# Patient Record
Sex: Female | Born: 1935 | Race: White | Hispanic: No | Smoking: Former smoker
Health system: Southern US, Community
[De-identification: ages and names within clinical notes are randomized; demographics above are authoritative.]

## PROBLEM LIST (undated history)

## (undated) DIAGNOSIS — N898 Other specified noninflammatory disorders of vagina: Secondary | ICD-10-CM

## (undated) DIAGNOSIS — Z87442 Personal history of urinary calculi: Secondary | ICD-10-CM

## (undated) DIAGNOSIS — N952 Postmenopausal atrophic vaginitis: Secondary | ICD-10-CM

## (undated) DIAGNOSIS — K589 Irritable bowel syndrome without diarrhea: Secondary | ICD-10-CM

## (undated) DIAGNOSIS — Z8739 Personal history of other diseases of the musculoskeletal system and connective tissue: Secondary | ICD-10-CM

## (undated) DIAGNOSIS — K219 Gastro-esophageal reflux disease without esophagitis: Secondary | ICD-10-CM

## (undated) DIAGNOSIS — R195 Other fecal abnormalities: Secondary | ICD-10-CM

## (undated) DIAGNOSIS — R194 Change in bowel habit: Secondary | ICD-10-CM

## (undated) DIAGNOSIS — R197 Diarrhea, unspecified: Secondary | ICD-10-CM

## (undated) HISTORY — DX: Personal history of other diseases of the musculoskeletal system and connective tissue: Z87.39

## (undated) HISTORY — DX: Postmenopausal atrophic vaginitis: N95.2

## (undated) HISTORY — DX: Other specified noninflammatory disorders of vagina: N89.8

## (undated) HISTORY — DX: Irritable bowel syndrome, unspecified: K58.9

## (undated) HISTORY — DX: Other fecal abnormalities: R19.5

## (undated) HISTORY — DX: Gastro-esophageal reflux disease without esophagitis: K21.9

## (undated) HISTORY — DX: Change in bowel habit: R19.4

---

## 2001-07-22 ENCOUNTER — Ambulatory Visit (HOSPITAL_COMMUNITY): Admission: RE | Admit: 2001-07-22 | Discharge: 2001-07-22 | Payer: Self-pay

## 2002-08-17 ENCOUNTER — Ambulatory Visit (HOSPITAL_COMMUNITY): Admission: RE | Admit: 2002-08-17 | Discharge: 2002-08-17 | Payer: Self-pay | Admitting: Unknown Physician Specialty

## 2002-08-17 ENCOUNTER — Encounter: Payer: Self-pay | Admitting: Unknown Physician Specialty

## 2003-09-01 ENCOUNTER — Ambulatory Visit (HOSPITAL_COMMUNITY): Admission: RE | Admit: 2003-09-01 | Discharge: 2003-09-01 | Payer: Self-pay | Admitting: Unknown Physician Specialty

## 2003-09-07 ENCOUNTER — Ambulatory Visit (HOSPITAL_COMMUNITY): Admission: RE | Admit: 2003-09-07 | Discharge: 2003-09-07 | Payer: Self-pay | Admitting: Family Medicine

## 2003-11-09 ENCOUNTER — Ambulatory Visit (HOSPITAL_COMMUNITY): Admission: RE | Admit: 2003-11-09 | Discharge: 2003-11-09 | Payer: Self-pay | Admitting: Internal Medicine

## 2004-12-30 ENCOUNTER — Ambulatory Visit (HOSPITAL_COMMUNITY): Admission: RE | Admit: 2004-12-30 | Discharge: 2004-12-30 | Payer: Self-pay | Admitting: Family Medicine

## 2005-07-17 ENCOUNTER — Ambulatory Visit (HOSPITAL_COMMUNITY): Admission: RE | Admit: 2005-07-17 | Discharge: 2005-07-17 | Payer: Self-pay | Admitting: Family Medicine

## 2005-07-21 ENCOUNTER — Ambulatory Visit (HOSPITAL_COMMUNITY): Admission: RE | Admit: 2005-07-21 | Discharge: 2005-07-21 | Payer: Self-pay | Admitting: Family Medicine

## 2006-05-22 ENCOUNTER — Ambulatory Visit (HOSPITAL_COMMUNITY): Admission: RE | Admit: 2006-05-22 | Discharge: 2006-05-22 | Payer: Self-pay | Admitting: Unknown Physician Specialty

## 2006-10-20 ENCOUNTER — Ambulatory Visit (HOSPITAL_COMMUNITY): Admission: RE | Admit: 2006-10-20 | Discharge: 2006-10-20 | Payer: Self-pay | Admitting: Family Medicine

## 2007-01-12 ENCOUNTER — Ambulatory Visit (HOSPITAL_COMMUNITY): Admission: RE | Admit: 2007-01-12 | Discharge: 2007-01-12 | Payer: Self-pay | Admitting: Family Medicine

## 2007-05-24 ENCOUNTER — Ambulatory Visit (HOSPITAL_COMMUNITY): Admission: RE | Admit: 2007-05-24 | Discharge: 2007-05-24 | Payer: Self-pay | Admitting: Unknown Physician Specialty

## 2008-05-09 ENCOUNTER — Ambulatory Visit (HOSPITAL_COMMUNITY): Admission: RE | Admit: 2008-05-09 | Discharge: 2008-05-09 | Payer: Self-pay | Admitting: Family Medicine

## 2008-06-01 ENCOUNTER — Ambulatory Visit (HOSPITAL_COMMUNITY): Admission: RE | Admit: 2008-06-01 | Discharge: 2008-06-01 | Payer: Self-pay | Admitting: Unknown Physician Specialty

## 2009-06-22 ENCOUNTER — Ambulatory Visit (HOSPITAL_COMMUNITY): Admission: RE | Admit: 2009-06-22 | Discharge: 2009-06-22 | Payer: Self-pay | Admitting: Unknown Physician Specialty

## 2010-06-24 ENCOUNTER — Ambulatory Visit (HOSPITAL_COMMUNITY): Admission: RE | Admit: 2010-06-24 | Payer: Self-pay | Admitting: Unknown Physician Specialty

## 2010-08-31 ENCOUNTER — Encounter: Payer: Self-pay | Admitting: Unknown Physician Specialty

## 2010-09-01 ENCOUNTER — Encounter (INDEPENDENT_AMBULATORY_CARE_PROVIDER_SITE_OTHER): Payer: Self-pay | Admitting: Internal Medicine

## 2010-12-27 NOTE — Op Note (Signed)
NAME:  Linda Stein, Linda Stein                         ACCOUNT NO.:  [id number]   MEDICAL RECORD NO.:  [id number]                   PATIENT TYPE:  AMB   LOCATION:  DAY                                  FACILITY:  [facility]   PHYSICIAN:  Lionel December, M.D.                 DATE OF BIRTH:  [birth year]   DATE OF PROCEDURE:  11/09/2003  DATE OF DISCHARGE:                                 OPERATIVE REPORT   PROCEDURE:  Total colonoscopy.   INDICATIONS:  Linda Stein is a 77 year old Caucasian female who is here for a  screening colonoscopy.  The family history is negative for colorectal  carcinoma.  She was not able to take all the prep, she was therefore given a  tap water enema in the preop area.  The procedure was reviewed with the  patient and informed consent was obtained.   PREOPERATIVE MEDICATIONS:  Demerol 50 mg IV, Versed 5 mg IV in divided  doses.   FINDINGS:  The procedure performed in endoscopy suite.  The patient's vital  signs and O2 saturation were monitored during the procedure and remained  stable.  The patient was placed in the left lateral position and rectal  examination performed.  No abnormalities were noted on external or digital  exam.  The Olympus video scope was placed into the rectum and advanced under  direct vision into the sigmoid colon and beyond.  A lot of thick liquid  stool as well as pieces of formed stool were noted throughout the colon.  Several areas were washed from the mucosa.  The scope was passed into the  cecum which was identified by the appendiceal orifice and the ileocecal  valve.  Pictures were taken for the record.  As the scope was withdrawn,  colonic mucosa was carefully examined and she had to be turned onto her back  and onto her right side in order to move the stool away so that the  underlying mucosa could be seen.  It was normal throughout.  The rectal  mucosa similarly was normal.  The scope was retroflexed to examine the  anorectal junction which was  unremarkable.  The endoscope was straightened  and withdrawn.  The patient tolerated the procedure well.   FINAL DIAGNOSIS:  Normal colonoscopy.  Exam was somewhat compromised because  of prep.   RECOMMENDATIONS:  She should continue yearly Hemoccults and consider her  next exam in 10 years from now.      ___________________________________________                                            Lionel December, M.D.   NR/MEDQ  D:  11/09/2003  T:  11/09/2003  Job:  [id number]   cc:   Mila Homer. Sudie Bailey, M.D.  [address]  [place], [place] [address]  Fax: [phone]   Ruthy Dick  [address]  [place]  [place] [address]  Fax: [phone]

## 2011-04-21 ENCOUNTER — Encounter (INDEPENDENT_AMBULATORY_CARE_PROVIDER_SITE_OTHER): Payer: Self-pay | Admitting: *Deleted

## 2011-04-21 ENCOUNTER — Encounter (INDEPENDENT_AMBULATORY_CARE_PROVIDER_SITE_OTHER): Payer: Self-pay

## 2011-05-19 ENCOUNTER — Ambulatory Visit (INDEPENDENT_AMBULATORY_CARE_PROVIDER_SITE_OTHER): Payer: Self-pay | Admitting: Internal Medicine

## 2011-12-08 ENCOUNTER — Other Ambulatory Visit (HOSPITAL_COMMUNITY): Payer: Self-pay | Admitting: Unknown Physician Specialty

## 2011-12-08 DIAGNOSIS — Z139 Encounter for screening, unspecified: Secondary | ICD-10-CM

## 2011-12-09 ENCOUNTER — Ambulatory Visit (HOSPITAL_COMMUNITY)
Admission: RE | Admit: 2011-12-09 | Discharge: 2011-12-09 | Disposition: A | Discharge status: Home / Self Care | Payer: Medicare Other | Source: Ambulatory Visit | Attending: Unknown Physician Specialty | Admitting: Unknown Physician Specialty

## 2011-12-09 DIAGNOSIS — Z1231 Encounter for screening mammogram for malignant neoplasm of breast: Secondary | ICD-10-CM | POA: Insufficient documentation

## 2011-12-09 DIAGNOSIS — Z139 Encounter for screening, unspecified: Secondary | ICD-10-CM

## 2012-11-15 ENCOUNTER — Ambulatory Visit (HOSPITAL_COMMUNITY)
Admission: RE | Admit: 2012-11-15 | Discharge: 2012-11-15 | Disposition: A | Discharge status: Home / Self Care | Payer: Medicare Other | Source: Ambulatory Visit | Attending: Family Medicine | Admitting: Family Medicine

## 2012-11-15 ENCOUNTER — Other Ambulatory Visit (HOSPITAL_COMMUNITY): Payer: Self-pay | Admitting: Family Medicine

## 2012-11-15 DIAGNOSIS — M79609 Pain in unspecified limb: Secondary | ICD-10-CM | POA: Insufficient documentation

## 2012-11-15 DIAGNOSIS — R52 Pain, unspecified: Secondary | ICD-10-CM

## 2013-01-17 ENCOUNTER — Other Ambulatory Visit (HOSPITAL_COMMUNITY): Payer: Self-pay | Admitting: Family Medicine

## 2013-01-17 DIAGNOSIS — Z139 Encounter for screening, unspecified: Secondary | ICD-10-CM

## 2013-01-20 ENCOUNTER — Ambulatory Visit (HOSPITAL_COMMUNITY)
Admission: RE | Admit: 2013-01-20 | Discharge: 2013-01-20 | Disposition: A | Discharge status: Home / Self Care | Payer: Medicare Other | Source: Ambulatory Visit | Attending: Family Medicine | Admitting: Family Medicine

## 2013-01-20 DIAGNOSIS — Z139 Encounter for screening, unspecified: Secondary | ICD-10-CM

## 2013-01-20 DIAGNOSIS — Z1231 Encounter for screening mammogram for malignant neoplasm of breast: Secondary | ICD-10-CM | POA: Insufficient documentation

## 2013-05-04 ENCOUNTER — Ambulatory Visit: Payer: Medicare Other | Admitting: Gastroenterology

## 2014-02-28 ENCOUNTER — Ambulatory Visit (HOSPITAL_COMMUNITY)
Admission: RE | Admit: 2014-02-28 | Discharge: 2014-02-28 | Disposition: A | Discharge status: Home / Self Care | Payer: Medicare Other | Source: Ambulatory Visit | Attending: Family Medicine | Admitting: Family Medicine

## 2014-02-28 ENCOUNTER — Other Ambulatory Visit (HOSPITAL_COMMUNITY): Payer: Self-pay | Admitting: Family Medicine

## 2014-02-28 DIAGNOSIS — M26622 Arthralgia of left temporomandibular joint: Secondary | ICD-10-CM

## 2014-02-28 DIAGNOSIS — R52 Pain, unspecified: Secondary | ICD-10-CM

## 2014-02-28 DIAGNOSIS — M26629 Arthralgia of temporomandibular joint, unspecified side: Secondary | ICD-10-CM | POA: Insufficient documentation

## 2014-03-17 NOTE — Telephone Encounter (Signed)
Open in error

## 2014-03-22 ENCOUNTER — Ambulatory Visit: Payer: Medicare Other | Admitting: Gastroenterology

## 2014-04-26 ENCOUNTER — Ambulatory Visit: Payer: Medicare Other | Admitting: Gastroenterology

## 2014-06-02 ENCOUNTER — Encounter: Payer: Self-pay | Admitting: Adult Health

## 2014-06-07 ENCOUNTER — Ambulatory Visit (INDEPENDENT_AMBULATORY_CARE_PROVIDER_SITE_OTHER): Payer: Medicare Other | Admitting: Adult Health

## 2014-06-07 ENCOUNTER — Encounter: Payer: Self-pay | Admitting: Adult Health

## 2014-06-07 VITALS — BP 140/70 | Ht 61.25 in | Wt 128.0 lb

## 2014-06-07 DIAGNOSIS — N952 Postmenopausal atrophic vaginitis: Secondary | ICD-10-CM | POA: Diagnosis not present

## 2014-06-07 DIAGNOSIS — N898 Other specified noninflammatory disorders of vagina: Secondary | ICD-10-CM | POA: Diagnosis not present

## 2014-06-07 HISTORY — DX: Postmenopausal atrophic vaginitis: N95.2

## 2014-06-07 HISTORY — DX: Other specified noninflammatory disorders of vagina: N89.8

## 2014-06-07 LAB — POCT WET PREP (WET MOUNT)

## 2014-06-07 NOTE — Patient Instructions (Signed)
Atrophic Vaginitis Atrophic vaginitis is a problem of low levels of estrogen in women. This problem can happen at any age. It is most common in women who have gone through menopause ("the change").  HOW WILL I KNOW IF I HAVE THIS PROBLEM? You may have:  Trouble with peeing (urinating), such as:  Going to the bathroom often.  A hard time holding your pee until you reach a bathroom.  Leaking pee.  Having pain when you pee.  Itching or a burning feeling.  Vaginal bleeding and spotting.  Pain during sex.  Dryness of the vagina.  A yellow, bad-smelling fluid (discharge) coming from the vagina. HOW WILL MY DOCTOR CHECK FOR THIS PROBLEM?  During your exam, your doctor will likely find the problem.  If there is a vaginal fluid, it may be checked for infection. HOW WILL THIS PROBLEM BE TREATED? Keep the vulvar skin as clean as possible. Moisturizers and lubricants can help with some of the symptoms. Estrogen replacement can help. There are 2 ways to take estrogen:  Systemic estrogen gets estrogen to your whole body. It takes many weeks or months before the symptoms get better.  You take an estrogen pill.  You use a skin patch. This is a patch that you put on your skin.  If you still have your uterus, your doctor may ask you to take a hormone. Talk to your doctor about the right medicine for you.  Estrogen cream.  This puts estrogen only at the part of your body where you apply it. The cream is put into the vagina or put on the vulvar skin. For some women, estrogen cream works faster than pills or the patch. CAN ALL WOMEN WITH THIS PROBLEM USE ESTROGEN? No. Women with certain types of cancer, liver problems, or problems with blood clots should not take estrogen. Your doctor can help you decide the best treatment for your symptoms. Document Released: 01/14/2008 Document Revised: 08/02/2013 Document Reviewed: 01/14/2008 [facility] Patient Information 2015 [place], [place]. This  information is not intended to replace advice given to you by your health care provider. Make sure you discuss any questions you have with your health care provider. Try LUVENA Follow up prn

## 2014-06-07 NOTE — Progress Notes (Signed)
Subjective:     Patient ID: Linda Stein, female   DOB: Jun 25, 1934, 80 y.o.   MRN: [id number]  HPI Linda Stein is a 80 year old white female, married, not sexually active in complaining of vaginal irritation feels like when used to have yeast infection.She is new to this practice.  Review of Systems See HPI Reviewed past medical,surgical, social and family history. Reviewed medications and allergies.     Objective:   Physical Exam BP 140/70  Ht 5' 1.25" (1.556 m)  Wt 128 lb (58.06 kg)  BMI 23.98 kg/m2   Skin warm and dry.Pelvic: external genitalia is normal in appearance for age, no lesions, vagina:has loss of rugae and moisture and pale in color with some strawberry appearance, cervix:smooth, atrophic, uterus: normal size, shape and contour, non tender, no masses felt, adnexa: no masses or tenderness noted. Wet prep: negative Has redness at rectum but has cream at home for that. Discussed atrophic tissues and could try estrace cream or luvena and she opts for luvena,does not want estrogen if possible.  Assessment:     Vaginal irritation Atrophic vaginitis     Plan:     Try Luvena Follow up prn Review handout on atrophic vaginitis

## 2014-06-12 ENCOUNTER — Encounter: Payer: Self-pay | Admitting: Adult Health

## 2014-06-29 ENCOUNTER — Telehealth: Payer: Self-pay | Admitting: *Deleted

## 2014-06-29 NOTE — Telephone Encounter (Signed)
Used luvena and felt better, now feels irritated has clobetasol at home can use every other day bid to outside tissue, and use luvena every 3 days and let me know how she feels in a bout a week

## 2015-04-04 ENCOUNTER — Other Ambulatory Visit (HOSPITAL_COMMUNITY): Payer: Self-pay | Admitting: Family Medicine

## 2015-04-04 DIAGNOSIS — Z1231 Encounter for screening mammogram for malignant neoplasm of breast: Secondary | ICD-10-CM

## 2015-04-06 ENCOUNTER — Ambulatory Visit (HOSPITAL_COMMUNITY)
Admission: RE | Admit: 2015-04-06 | Discharge: 2015-04-06 | Disposition: A | Discharge status: Home / Self Care | Payer: Medicare Other | Source: Ambulatory Visit | Attending: Family Medicine | Admitting: Family Medicine

## 2015-04-06 DIAGNOSIS — Z1231 Encounter for screening mammogram for malignant neoplasm of breast: Secondary | ICD-10-CM | POA: Diagnosis present

## 2016-07-10 ENCOUNTER — Ambulatory Visit (HOSPITAL_COMMUNITY)
Admission: RE | Admit: 2016-07-10 | Discharge: 2016-07-10 | Disposition: A | Discharge status: Home / Self Care | Payer: Medicare Other | Source: Ambulatory Visit | Attending: Family Medicine | Admitting: Family Medicine

## 2016-07-10 ENCOUNTER — Other Ambulatory Visit (HOSPITAL_COMMUNITY): Payer: Self-pay | Admitting: Family Medicine

## 2016-07-10 DIAGNOSIS — M7989 Other specified soft tissue disorders: Secondary | ICD-10-CM | POA: Insufficient documentation

## 2016-07-10 DIAGNOSIS — R609 Edema, unspecified: Secondary | ICD-10-CM

## 2016-07-10 DIAGNOSIS — M79672 Pain in left foot: Secondary | ICD-10-CM | POA: Diagnosis present

## 2016-09-08 ENCOUNTER — Ambulatory Visit (INDEPENDENT_AMBULATORY_CARE_PROVIDER_SITE_OTHER): Payer: Medicare Other | Admitting: Otolaryngology

## 2016-09-08 DIAGNOSIS — H903 Sensorineural hearing loss, bilateral: Secondary | ICD-10-CM

## 2016-09-08 DIAGNOSIS — H9313 Tinnitus, bilateral: Secondary | ICD-10-CM

## 2016-09-10 ENCOUNTER — Ambulatory Visit: Payer: Medicare Other | Admitting: Podiatry

## 2016-09-18 ENCOUNTER — Encounter: Payer: Self-pay | Admitting: Podiatry

## 2016-09-18 ENCOUNTER — Ambulatory Visit (INDEPENDENT_AMBULATORY_CARE_PROVIDER_SITE_OTHER): Payer: Medicare Other | Admitting: Podiatry

## 2016-09-18 ENCOUNTER — Ambulatory Visit (INDEPENDENT_AMBULATORY_CARE_PROVIDER_SITE_OTHER): Payer: Medicare Other

## 2016-09-18 VITALS — BP 131/71 | HR 76 | Resp 16 | Ht 61.0 in | Wt 127.0 lb

## 2016-09-18 DIAGNOSIS — Q828 Other specified congenital malformations of skin: Secondary | ICD-10-CM

## 2016-09-18 DIAGNOSIS — M779 Enthesopathy, unspecified: Secondary | ICD-10-CM | POA: Diagnosis not present

## 2016-09-18 NOTE — Progress Notes (Signed)
   Subjective:    Patient ID: Linda Stein, female    DOB: Feb 18, 1934, 83 y.o.   MRN: [id number]  HPI  Chief Complaint  Patient presents with  . Foot Pain    Left; Swelling Top of foot, beneath toes x 3 months; Also has pain on medial side of foot beneath great toe.   . Callouses    Right; Plantar Forefoot x 3 weeks. Pt states that she is unable to walk barefoot because it causes pain.     She presents today with a chief complaint of pain to the second metatarsophalangeal joint area of the left foot but calluses to the plantar aspect of the right foot.    Review of Systems     Objective:   Physical Exam vital signs are stable alert and oriented 3. Pulses are strongly palpable. Neurologic sensorium is intact. Deep tendon reflexes are intact. Muscle strength is normal. Hammertoe deformities are noted bilateral. Porokeratotic lesion subsecond metatarsophalangeal joint of the right foot is noted. She has pain on end range of motion of the subtalar joint and of the second metatarsophalangeal joint of the left foot.        Assessment & Plan:  Assessment: Pain in limb secondary porokeratosis plantar aspect of the right foot. And capsulitis second metatarsophalangeal joint left foot.  Plan: Debrided reactive hyperkeratoses. I also injected her left second metatarsophalangeal joint area with Kenalog and local anesthetic placed her in a Darco shoe. Follow up with her in 1 month

## 2016-10-14 ENCOUNTER — Ambulatory Visit: Payer: Medicare Other | Admitting: Podiatry

## 2016-11-04 ENCOUNTER — Ambulatory Visit: Payer: Medicare Other | Admitting: Podiatry

## 2016-11-06 ENCOUNTER — Encounter: Payer: Self-pay | Admitting: Podiatry

## 2016-11-06 ENCOUNTER — Ambulatory Visit (INDEPENDENT_AMBULATORY_CARE_PROVIDER_SITE_OTHER): Payer: Medicare Other | Admitting: Podiatry

## 2016-11-06 DIAGNOSIS — M2042 Other hammer toe(s) (acquired), left foot: Secondary | ICD-10-CM

## 2016-11-06 DIAGNOSIS — M779 Enthesopathy, unspecified: Secondary | ICD-10-CM | POA: Diagnosis not present

## 2016-11-06 NOTE — Progress Notes (Signed)
She presents today for follow-up of her capsulitis second metatarsophalangeal joint left foot states that is okay as long as I keep wearing a shoe.  Objective: Vital signs are stable she is alert and oriented 3 she has pain on palpation second metatarsophalangeal joint of the left foot. Pain on end range of motion of the second metatarsophalangeal joint left foot.  Assessment: Plantar flexed second metatarsal resulting capsulitis and hammertoe deformity.  Plan: Discussed etiology pathology conservative versus surgical therapies injected the area today with anesthetic. She will continue use of the Darco shoe follow-up with her in 1 month. They need to consider MRI or surgical consult.

## 2016-12-04 ENCOUNTER — Encounter: Payer: Self-pay | Admitting: Podiatry

## 2016-12-04 ENCOUNTER — Ambulatory Visit (INDEPENDENT_AMBULATORY_CARE_PROVIDER_SITE_OTHER): Payer: Medicare Other | Admitting: Podiatry

## 2016-12-04 DIAGNOSIS — M2042 Other hammer toe(s) (acquired), left foot: Secondary | ICD-10-CM | POA: Diagnosis not present

## 2016-12-04 DIAGNOSIS — M779 Enthesopathy, unspecified: Secondary | ICD-10-CM | POA: Diagnosis not present

## 2016-12-04 NOTE — Progress Notes (Signed)
She presents today for follow-up of her capsulitis second metatarsophalangeal joint of the left foot. She states it is about 90% improved but still has some tenderness down there.  Objective: Vital signs are stable alert and oriented 3. Pulses are palpable. Neurologic symptoms intact. Deep tendon reflexes are intact. She is hammertoe deformities physical nature. She has pain on palpation to the third metatarsophalangeal joint with moderate to severe fat pad atrophy.  Assessment: Rigid deformity resulting in capsulitis of the forefoot left.  Plan: I injected intra-articular today after sterile Betadine skin prep 2 mg of dexamethasone and local anesthetic directly into the third metatarsophalangeal joint. Patient tolerated this procedure well will follow up with me in 6 weeks if necessary.

## 2017-01-15 ENCOUNTER — Ambulatory Visit: Payer: Medicare Other | Admitting: Podiatry

## 2017-04-23 ENCOUNTER — Encounter: Payer: Self-pay | Admitting: Podiatry

## 2017-04-23 ENCOUNTER — Ambulatory Visit (INDEPENDENT_AMBULATORY_CARE_PROVIDER_SITE_OTHER): Payer: Medicare Other | Admitting: Podiatry

## 2017-04-23 DIAGNOSIS — Q828 Other specified congenital malformations of skin: Secondary | ICD-10-CM

## 2017-04-23 DIAGNOSIS — M779 Enthesopathy, unspecified: Secondary | ICD-10-CM

## 2017-04-23 NOTE — Progress Notes (Signed)
She presents today for follow-up of her capsulitis. States that is doing okay for a while and now is starting to hurt again. She also has calluses on the fifth metatarsophalangeal joint plantarly.  Objective: Vital signs are stable alert and oriented 3 pulses are palpable. She has hammertoe deformities resulting in capsulitis of the second metatarsophalangeal joint as well as a cocked up fifth digit resulting in plantar benign lesion.  Assessment: Hammertoe deformities. Benign porokeratotic lesions.  Plan: Debrided all reactive hyperkeratoses today injected around the second metatarsophalangeal joint with Kenalog and local anesthetic.

## 2017-08-17 ENCOUNTER — Other Ambulatory Visit (HOSPITAL_COMMUNITY): Payer: Self-pay | Admitting: Family Medicine

## 2017-08-17 ENCOUNTER — Ambulatory Visit (HOSPITAL_COMMUNITY)
Admission: RE | Admit: 2017-08-17 | Discharge: 2017-08-17 | Disposition: A | Discharge status: Home / Self Care | Payer: Medicare Other | Source: Ambulatory Visit | Attending: Family Medicine | Admitting: Family Medicine

## 2017-08-17 DIAGNOSIS — R05 Cough: Secondary | ICD-10-CM | POA: Insufficient documentation

## 2017-08-17 DIAGNOSIS — X58XXXA Exposure to other specified factors, initial encounter: Secondary | ICD-10-CM | POA: Diagnosis not present

## 2017-08-17 DIAGNOSIS — S63111A Subluxation of metacarpophalangeal joint of right thumb, initial encounter: Secondary | ICD-10-CM | POA: Diagnosis not present

## 2017-08-17 DIAGNOSIS — R059 Cough, unspecified: Secondary | ICD-10-CM

## 2017-08-17 DIAGNOSIS — R609 Edema, unspecified: Secondary | ICD-10-CM

## 2017-08-17 DIAGNOSIS — M19041 Primary osteoarthritis, right hand: Secondary | ICD-10-CM | POA: Diagnosis not present

## 2017-08-17 DIAGNOSIS — M7989 Other specified soft tissue disorders: Secondary | ICD-10-CM | POA: Diagnosis present

## 2017-09-08 ENCOUNTER — Ambulatory Visit: Payer: Medicare Other | Admitting: Podiatry

## 2017-09-22 ENCOUNTER — Ambulatory Visit: Payer: Medicare Other | Admitting: Podiatry

## 2017-10-06 ENCOUNTER — Ambulatory Visit: Payer: Medicare Other | Admitting: Podiatry

## 2017-10-06 ENCOUNTER — Encounter: Payer: Self-pay | Admitting: Podiatry

## 2017-10-06 DIAGNOSIS — M778 Other enthesopathies, not elsewhere classified: Secondary | ICD-10-CM

## 2017-10-06 DIAGNOSIS — M779 Enthesopathy, unspecified: Principal | ICD-10-CM

## 2017-10-06 DIAGNOSIS — M7752 Other enthesopathy of left foot: Secondary | ICD-10-CM | POA: Diagnosis not present

## 2017-10-06 DIAGNOSIS — Q828 Other specified congenital malformations of skin: Secondary | ICD-10-CM | POA: Diagnosis not present

## 2017-10-06 NOTE — Progress Notes (Signed)
She presents today for follow-up of capsulitis second metatarsophalangeal joint of the left foot.  This is hurting me again and then having to wear that shoe again.  She is also complaining of painful thickened calluses bilateral foot.  Objective: Vital signs are stable alert oriented x3.  Pulses are palpable.  Neurologic sensorium is within normal limits.  She has pain on end range of motion of the third metatarsal phalangeal joint.  This is noted in the left foot.  Reactive hyperkeratosis plantar aspect of the forefoot bilateral.  Assessment: Capsulitis left foot.  Calluses bilateral foot.  Plan: Debrided all reactive hyperkeratosis today thoroughly without iatrogenic lesions.  After sterile Betadine skin prep I injected between the second and third digits of the left foot with 20 mg Kenalog 5 mg Marcaine to the point of maximal tenderness.  Tolerated procedure well without complications.

## 2017-10-16 DIAGNOSIS — R0989 Other specified symptoms and signs involving the circulatory and respiratory systems: Secondary | ICD-10-CM | POA: Insufficient documentation

## 2017-10-16 DIAGNOSIS — R05 Cough: Secondary | ICD-10-CM | POA: Insufficient documentation

## 2017-10-16 DIAGNOSIS — K219 Gastro-esophageal reflux disease without esophagitis: Secondary | ICD-10-CM | POA: Insufficient documentation

## 2017-10-16 DIAGNOSIS — R053 Chronic cough: Secondary | ICD-10-CM | POA: Insufficient documentation

## 2017-11-30 ENCOUNTER — Other Ambulatory Visit (HOSPITAL_COMMUNITY): Payer: Self-pay | Admitting: Family Medicine

## 2017-11-30 ENCOUNTER — Ambulatory Visit (HOSPITAL_COMMUNITY)
Admission: RE | Admit: 2017-11-30 | Discharge: 2017-11-30 | Disposition: A | Discharge status: Home / Self Care | Payer: Medicare Other | Source: Ambulatory Visit | Attending: Family Medicine | Admitting: Family Medicine

## 2017-11-30 DIAGNOSIS — M5136 Other intervertebral disc degeneration, lumbar region: Secondary | ICD-10-CM | POA: Insufficient documentation

## 2017-11-30 DIAGNOSIS — N200 Calculus of kidney: Secondary | ICD-10-CM | POA: Insufficient documentation

## 2017-11-30 DIAGNOSIS — M79604 Pain in right leg: Secondary | ICD-10-CM | POA: Insufficient documentation

## 2017-11-30 DIAGNOSIS — M79605 Pain in left leg: Secondary | ICD-10-CM | POA: Diagnosis present

## 2017-11-30 DIAGNOSIS — M545 Low back pain: Secondary | ICD-10-CM | POA: Insufficient documentation

## 2017-12-15 ENCOUNTER — Other Ambulatory Visit (HOSPITAL_COMMUNITY): Payer: Self-pay | Admitting: Family Medicine

## 2017-12-15 DIAGNOSIS — Z780 Asymptomatic menopausal state: Secondary | ICD-10-CM

## 2017-12-23 ENCOUNTER — Ambulatory Visit (HOSPITAL_COMMUNITY)
Admission: RE | Admit: 2017-12-23 | Discharge: 2017-12-23 | Disposition: A | Discharge status: Home / Self Care | Payer: Medicare Other | Source: Ambulatory Visit | Attending: Family Medicine | Admitting: Family Medicine

## 2017-12-23 DIAGNOSIS — Z780 Asymptomatic menopausal state: Secondary | ICD-10-CM | POA: Diagnosis present

## 2017-12-23 DIAGNOSIS — M810 Age-related osteoporosis without current pathological fracture: Secondary | ICD-10-CM | POA: Diagnosis not present

## 2018-01-18 ENCOUNTER — Other Ambulatory Visit (HOSPITAL_COMMUNITY)
Admission: RE | Admit: 2018-01-18 | Discharge: 2018-01-18 | Disposition: A | Discharge status: Home / Self Care | Payer: Medicare Other | Source: Ambulatory Visit | Attending: *Deleted | Admitting: *Deleted

## 2018-01-18 ENCOUNTER — Ambulatory Visit (HOSPITAL_COMMUNITY)
Admission: RE | Admit: 2018-01-18 | Discharge: 2018-01-18 | Disposition: A | Discharge status: Home / Self Care | Payer: Medicare Other | Source: Ambulatory Visit | Attending: Family Medicine | Admitting: Family Medicine

## 2018-01-18 ENCOUNTER — Other Ambulatory Visit (HOSPITAL_COMMUNITY): Payer: Self-pay | Admitting: Family Medicine

## 2018-01-18 DIAGNOSIS — J189 Pneumonia, unspecified organism: Secondary | ICD-10-CM | POA: Diagnosis present

## 2018-01-18 DIAGNOSIS — R918 Other nonspecific abnormal finding of lung field: Secondary | ICD-10-CM | POA: Diagnosis not present

## 2018-01-18 DIAGNOSIS — J9811 Atelectasis: Secondary | ICD-10-CM | POA: Diagnosis not present

## 2018-01-18 LAB — CBC
HCT: 35.6 % — ABNORMAL LOW (ref 36.0–46.0)
Hemoglobin: 11.7 g/dL — ABNORMAL LOW (ref 12.0–15.0)
MCH: 28.9 pg (ref 26.0–34.0)
MCHC: 32.9 g/dL (ref 30.0–36.0)
MCV: 87.9 fL (ref 78.0–100.0)
PLATELETS: 228 10*3/uL (ref 150–400)
RBC: 4.05 MIL/uL (ref 3.87–5.11)
RDW: 14.5 % (ref 11.5–15.5)
WBC: 5.9 10*3/uL (ref 4.0–10.5)

## 2018-01-18 LAB — BASIC METABOLIC PANEL
Anion gap: 8 (ref 5–15)
BUN: 24 mg/dL — AB (ref 6–20)
CALCIUM: 9.3 mg/dL (ref 8.9–10.3)
CO2: 24 mmol/L (ref 22–32)
CREATININE: 0.74 mg/dL (ref 0.44–1.00)
Chloride: 107 mmol/L (ref 101–111)
GFR calc Af Amer: >60 mL/min (ref >60)
GLUCOSE: 123 mg/dL — AB (ref 65–99)
Potassium: 3.7 mmol/L (ref 3.5–5.1)
SODIUM: 139 mmol/L (ref 135–145)

## 2018-01-22 ENCOUNTER — Ambulatory Visit (HOSPITAL_COMMUNITY)
Admission: RE | Admit: 2018-01-22 | Discharge: 2018-01-22 | Disposition: A | Discharge status: Home / Self Care | Payer: Medicare Other | Source: Ambulatory Visit | Attending: Family Medicine | Admitting: Family Medicine

## 2018-01-22 ENCOUNTER — Other Ambulatory Visit (HOSPITAL_COMMUNITY): Payer: Self-pay | Admitting: Family Medicine

## 2018-01-22 DIAGNOSIS — R05 Cough: Secondary | ICD-10-CM | POA: Insufficient documentation

## 2018-01-22 DIAGNOSIS — J189 Pneumonia, unspecified organism: Secondary | ICD-10-CM | POA: Diagnosis not present

## 2018-01-22 DIAGNOSIS — R059 Cough, unspecified: Secondary | ICD-10-CM

## 2018-02-22 ENCOUNTER — Other Ambulatory Visit (HOSPITAL_COMMUNITY): Payer: Self-pay | Admitting: Family Medicine

## 2018-02-22 DIAGNOSIS — M5136 Other intervertebral disc degeneration, lumbar region: Secondary | ICD-10-CM

## 2018-03-02 ENCOUNTER — Ambulatory Visit (HOSPITAL_COMMUNITY)
Admission: RE | Admit: 2018-03-02 | Discharge: 2018-03-02 | Disposition: A | Discharge status: Home / Self Care | Payer: Medicare Other | Source: Ambulatory Visit | Attending: Family Medicine | Admitting: Family Medicine

## 2018-03-02 DIAGNOSIS — M1288 Other specific arthropathies, not elsewhere classified, other specified site: Secondary | ICD-10-CM | POA: Insufficient documentation

## 2018-03-02 DIAGNOSIS — M4317 Spondylolisthesis, lumbosacral region: Secondary | ICD-10-CM | POA: Diagnosis not present

## 2018-03-02 DIAGNOSIS — M5136 Other intervertebral disc degeneration, lumbar region: Secondary | ICD-10-CM | POA: Diagnosis present

## 2018-03-02 DIAGNOSIS — M48061 Spinal stenosis, lumbar region without neurogenic claudication: Secondary | ICD-10-CM | POA: Insufficient documentation

## 2018-03-29 ENCOUNTER — Other Ambulatory Visit: Payer: Self-pay | Admitting: Nurse Practitioner

## 2018-03-29 DIAGNOSIS — M5136 Other intervertebral disc degeneration, lumbar region: Secondary | ICD-10-CM

## 2018-04-08 ENCOUNTER — Ambulatory Visit
Admission: RE | Admit: 2018-04-08 | Discharge: 2018-04-08 | Disposition: A | Discharge status: Home / Self Care | Payer: Medicare Other | Source: Ambulatory Visit | Attending: Nurse Practitioner | Admitting: Nurse Practitioner

## 2018-04-08 DIAGNOSIS — M5136 Other intervertebral disc degeneration, lumbar region: Secondary | ICD-10-CM

## 2018-04-08 DIAGNOSIS — M51369 Other intervertebral disc degeneration, lumbar region without mention of lumbar back pain or lower extremity pain: Secondary | ICD-10-CM

## 2018-04-08 MED ORDER — IOPAMIDOL (ISOVUE-M 200) INJECTION 41%
1.0000 mL | Freq: Once | INTRAMUSCULAR | Status: AC
  Administered 2018-04-08: 1 mL via EPIDURAL

## 2018-04-08 MED ORDER — METHYLPREDNISOLONE ACETATE 40 MG/ML INJ SUSP (RADIOLOG
120.0000 mg | Freq: Once | INTRAMUSCULAR | Status: AC
  Administered 2018-04-08: 120 mg via EPIDURAL

## 2018-04-08 NOTE — Discharge Instructions (Signed)

## 2018-04-22 ENCOUNTER — Other Ambulatory Visit: Payer: Self-pay

## 2018-04-22 ENCOUNTER — Emergency Department (HOSPITAL_COMMUNITY): Payer: Medicare Other

## 2018-04-22 ENCOUNTER — Emergency Department (HOSPITAL_COMMUNITY)
Admission: EM | Admit: 2018-04-22 | Discharge: 2018-04-22 | Disposition: A | Discharge status: Home / Self Care | Payer: Medicare Other | Attending: Emergency Medicine | Admitting: Emergency Medicine

## 2018-04-22 ENCOUNTER — Encounter (HOSPITAL_COMMUNITY): Payer: Self-pay

## 2018-04-22 DIAGNOSIS — N200 Calculus of kidney: Secondary | ICD-10-CM | POA: Insufficient documentation

## 2018-04-22 DIAGNOSIS — Z87891 Personal history of nicotine dependence: Secondary | ICD-10-CM | POA: Diagnosis not present

## 2018-04-22 DIAGNOSIS — R1032 Left lower quadrant pain: Secondary | ICD-10-CM | POA: Diagnosis present

## 2018-04-22 LAB — URINALYSIS, ROUTINE W REFLEX MICROSCOPIC
BILIRUBIN URINE: NEGATIVE
GLUCOSE, UA: NEGATIVE mg/dL
Ketones, ur: 5 mg/dL — AB
LEUKOCYTES UA: NEGATIVE
NITRITE: NEGATIVE
PROTEIN: 100 mg/dL — AB
Specific Gravity, Urine: 1.014 (ref 1.005–1.030)
pH: 7 (ref 5.0–8.0)

## 2018-04-22 LAB — BASIC METABOLIC PANEL
Anion gap: 5 (ref 5–15)
BUN: 18 mg/dL (ref 8–23)
CALCIUM: 9.1 mg/dL (ref 8.9–10.3)
CHLORIDE: 107 mmol/L (ref 98–111)
CO2: 25 mmol/L (ref 22–32)
CREATININE: 0.75 mg/dL (ref 0.44–1.00)
Glucose, Bld: 115 mg/dL — ABNORMAL HIGH (ref 70–99)
Potassium: 4 mmol/L (ref 3.5–5.1)
Sodium: 137 mmol/L (ref 135–145)

## 2018-04-22 LAB — CBC
HCT: 40.6 % (ref 36.0–46.0)
HEMOGLOBIN: 13.3 g/dL (ref 12.0–15.0)
MCH: 28.6 pg (ref 26.0–34.0)
MCHC: 32.8 g/dL (ref 30.0–36.0)
MCV: 87.3 fL (ref 78.0–100.0)
PLATELETS: 184 10*3/uL (ref 150–400)
RBC: 4.65 MIL/uL (ref 3.87–5.11)
RDW: 14 % (ref 11.5–15.5)
WBC: 9.6 10*3/uL (ref 4.0–10.5)

## 2018-04-22 MED ORDER — ONDANSETRON HCL 4 MG/2ML IJ SOLN
4.0000 mg | Freq: Once | INTRAMUSCULAR | Status: AC
  Administered 2018-04-22: 4 mg via INTRAVENOUS
  Filled 2018-04-22: qty 2

## 2018-04-22 MED ORDER — NAPROXEN 500 MG PO TABS: 500.0000 mg | ORAL_TABLET | Freq: Two times a day (BID) | ORAL | 0 refills | Status: DC

## 2018-04-22 MED ORDER — OXYCODONE-ACETAMINOPHEN 5-325 MG PO TABS: 1.0000 | ORAL_TABLET | Freq: Four times a day (QID) | ORAL | 0 refills | Status: DC | PRN

## 2018-04-22 MED ORDER — KETOROLAC TROMETHAMINE 30 MG/ML IJ SOLN
30.0000 mg | Freq: Once | INTRAMUSCULAR | Status: AC
  Administered 2018-04-22: 30 mg via INTRAVENOUS
  Filled 2018-04-22: qty 1

## 2018-04-22 MED ORDER — SODIUM CHLORIDE 0.9 % IV SOLN
INTRAVENOUS | Status: DC
  Administered 2018-04-22: 13:00:00 via INTRAVENOUS

## 2018-04-22 MED ORDER — HYDROMORPHONE HCL 1 MG/ML IJ SOLN
0.5000 mg | Freq: Once | INTRAMUSCULAR | Status: AC
  Administered 2018-04-22: 0.5 mg via INTRAVENOUS
  Filled 2018-04-22: qty 1

## 2018-04-22 MED ORDER — ONDANSETRON 4 MG PO TBDP: 4.0000 mg | ORAL_TABLET | Freq: Three times a day (TID) | ORAL | 0 refills | Status: DC | PRN

## 2018-04-22 NOTE — ED Triage Notes (Signed)
Pt is having severe pain in her left flank area. States it feels very sharp and stabbing. Does radiate to the front as well. No trouble urinating so far. Is nauseous in triage, but not vomiting.

## 2018-04-22 NOTE — Discharge Instructions (Signed)
Please use the urine strainer to catch the stone as you urinate  Please take the medications exactly as prescribed including Naprosyn twice a day, Zofran as needed for nausea, Percocet 1 tablet every 6 hours as needed for severe pain.  Emergency department for severe or worsening pain vomiting or any fevers.  Call the urology office to arrange follow-up within the next several days if you have not yet passed this kidney stone.

## 2018-04-22 NOTE — ED Provider Notes (Signed)
[facility] EMERGENCY DEPARTMENT Provider Note   CSN: [id number] Arrival date & time: 04/22/18  1158     History   Chief Complaint Chief Complaint  Patient presents with  . Abdominal Pain    HPI Linda Stein is a 84 y.o. female.  HPI  L lower back pain - worsened over the last hour -s tarted this morning. No hx of KS, no other sig PMH Pain radiates across the lower abd.  Pain is constant Worse with movement Nausea but no vomiting, no urinary sx, no f/c. No pain with BM's.  2 BM's today already without blood No CP with this - no recent heavy lifting, no trauma.   No surgical history to back / abdomen other than remote C section.  Past Medical History:  Diagnosis Date  . Atrophic vaginitis 06/07/2014  . Change in bowel habits   . Irritable bowel syndrome   . Mucus in stool   . Vaginal irritation 06/07/2014    Patient Active Problem List   Diagnosis Date Noted  . Vaginal irritation 06/07/2014  . Atrophic vaginitis 06/07/2014    Past Surgical History:  Procedure Laterality Date  . CESAREAN SECTION       OB History    Gravida  1   Para  1   Term      Preterm      AB      Living  1     SAB      TAB      Ectopic      Multiple      Live Births               Home Medications    Prior to Admission medications   Medication Sig Start Date End Date Taking? Authorizing Provider  alendronate (FOSAMAX) 70 MG tablet Take 70 mg by mouth every Monday.  04/13/18  Yes [provider]  Cholecalciferol (VITAMIN D3 PO) Take 1 capsule by mouth daily.   Yes [provider]  naproxen (NAPROSYN) 500 MG tablet Take 1 tablet (500 mg total) by mouth 2 (two) times daily with a meal. 04/22/18   Noemi Chapel, MD  ondansetron (ZOFRAN ODT) 4 MG disintegrating tablet Take 1 tablet (4 mg total) by mouth every 8 (eight) hours as needed for nausea. 04/22/18   Noemi Chapel, MD  oxyCODONE-acetaminophen (PERCOCET) 5-325 MG tablet Take 1 tablet by mouth  every 6 (six) hours as needed. 04/22/18   Noemi Chapel, MD    Family History Family History  Problem Relation Age of Onset  . Other Mother        aneursym  . Stroke Mother   . Cancer Father        bone  . Other Son        hip replacement    Social History Social History   Tobacco Use  . Smoking status: Former Smoker    Types: Cigarettes  . Smokeless tobacco: Never Used  Substance Use Topics  . Alcohol use: No  . Drug use: No     Allergies   Sulfa antibiotics   Review of Systems Review of Systems  All other systems reviewed and are negative.    Physical Exam Updated Vital Signs BP 121/60   Pulse 70   Temp 98 F (36.7 C) (Oral)   Resp 16   Ht 1.575 m (5\' 2" )   Wt 54.9 kg   SpO2 98%   BMI 22.13 kg/m   Physical Exam  Constitutional: She appears well-developed and well-nourished. No distress.  Uncomfortable.  HENT:  Head: Normocephalic and atraumatic.  Mouth/Throat: Oropharynx is clear and moist. No oropharyngeal exudate.  Eyes: Pupils are equal, round, and reactive to light. Conjunctivae and EOM are normal. Right eye exhibits no discharge. Left eye exhibits no discharge. No scleral icterus.  Neck: Normal range of motion. Neck supple. No JVD present. No thyromegaly present.  Cardiovascular: Normal rate, regular rhythm, normal heart sounds and intact distal pulses. Exam reveals no gallop and no friction rub.  No murmur heard. Pulmonary/Chest: Effort normal and breath sounds normal. No respiratory distress. She has no wheezes. She has no rales.  Abdominal: Soft. Bowel sounds are normal. She exhibits no distension and no mass. There is no tenderness.  BS normal - no ttp, no pulsating masses.  Musculoskeletal: Normal range of motion. She exhibits no edema or tenderness.  Has ttp in the L SI joint - no other spinal ttp, no CVA ttp.  Lymphadenopathy:    She has no cervical adenopathy.  Neurological: She is alert. Coordination normal.  Normal sensation and  normal strength / pedal pulses of legs.  Skin: Skin is warm and dry. No rash noted. No erythema.  Psychiatric: She has a normal mood and affect. Her behavior is normal.  Nursing note and vitals reviewed.    ED Treatments / Results  Labs (all labs ordered are listed, but only abnormal results are displayed) Labs Reviewed  URINALYSIS, ROUTINE W REFLEX MICROSCOPIC - Abnormal; Notable for the following components:      Result Value   APPearance HAZY (*)    Hgb urine dipstick LARGE (*)    Ketones, ur 5 (*)    Protein, ur 100 (*)    RBC / HPF >50 (*)    Bacteria, UA RARE (*)    All other components within normal limits  BASIC METABOLIC PANEL - Abnormal; Notable for the following components:   Glucose, Bld 115 (*)    All other components within normal limits  CBC    EKG None  Radiology Ct Renal Stone Study  Result Date: 04/22/2018 CLINICAL DATA:  Left-sided flank pain since this morning now in the low left pelvic region, no hematuria EXAM: CT ABDOMEN AND PELVIS WITHOUT CONTRAST TECHNIQUE: Multidetector CT imaging of the abdomen and pelvis was performed following the standard protocol without IV contrast. COMPARISON:  None. FINDINGS: Lower chest: A tiny nodule in the sub pleural space in the posterior left lower lobe is noted on image number 19 series 4 of 4 mm in diameter of doubtful significance in this age patient. Otherwise the lungs are clear. The heart is mildly enlarged. No pericardial effusion is seen. There does appear to be a small hiatal hernia present. Hepatobiliary: The liver is unremarkable in the unenhanced state. Calcified gallstones layer in the gallbladder and the gallbladder wall is not thickened. Pancreas: The pancreas is normal in size and the pancreatic duct is not dilated. Spleen: The spleen is somewhat elongated but unremarkable. Adrenals/Urinary Tract: The adrenal glands appear normal. Right renal calculi are present with a 7 mm calculus in the upper pole on the right  which is nonobstructing. A 7 mm calculus also is noted within the right renal pelvis which may intermittently obstruct. No current obstruction on the right is seen. However, there is fullness of the left pelvocaliceal system left and left ureter to point of obstruction by a 6 mm distal left ureteral calculus only a few cms from the expected left  UV junction. Urinary bladder is moderately distended and is unremarkable. Stomach/Bowel: The stomach is relatively decompressed and there is a small hiatal hernia present. No abnormality of small bowel is noted. The colon is somewhat elongated and tortuous. There is feces within the right colon. The appendix appears decompressed and there are small appendicoliths present. No inflammatory process is noted. Vascular/Lymphatic: Abdominal aorta is normal in caliber with moderate abdominal aortic atherosclerosis noted. No adenopathy is seen. Reproductive: The uterus is normal in size. No adnexal lesion is seen. No free fluid is noted within the pelvis Other: No abdominal wall hernia is seen. Musculoskeletal: The lumbar vertebrae are normal alignment. There is degenerative disc disease at L1-2 and L4-5 levels. No acute abnormality is seen. IMPRESSION: 1. Mild to moderate left hydronephrosis and hydroureter to point of obstruction by 6 mm distal left ureteral calculus only a few cm from the expected left UV junction. 2. Several right renal calculi which are nonobstructive. A 7 mm calculus is present within the right renal pelvis which may intermittently obstruct. 3. The appendix is normal in contour, but there are small appendicoliths present. 4. Moderate abdominal aortic atherosclerosis. 5. 4 mm subpleural nodule in the left lower lobe of doubtful clinical significance in this age patient. Electronically Signed   By: Ivar Drape M.D.   On: 04/22/2018 15:27    Procedures Procedures (including critical care time)  Medications Ordered in ED Medications  0.9 %  sodium chloride  infusion ( Intravenous Rate/Dose Verify 04/22/18 1339)  ondansetron (ZOFRAN) injection 4 mg (4 mg Intravenous Given 04/22/18 1302)  ketorolac (TORADOL) 30 MG/ML injection 30 mg (30 mg Intravenous Given 04/22/18 1302)  HYDROmorphone (DILAUDID) injection 0.5 mg (0.5 mg Intravenous Given 04/22/18 1505)     Initial Impression / Assessment and Plan / ED Course  I have reviewed the triage vital signs and the nursing notes.  Pertinent labs & imaging results that were available during my care of the patient were reviewed by me and considered in my medical decision making (see chart for details).     The patient has no surgical findings on exam including any neurologic findings of her legs to indicate spinal cord problems, she has a bedside ultrasound performed by myself showing no signs of hydronephrosis, these images were not archived.  She will need a urinalysis, pain and nausea control and a CT renal study to rule out renal stones or ureterolithiasis.  Patient agreeable  Urinalysis consistent with hematuria, blood counts normal, metabolic panel normal including creatinine and renal function.  CT of the abdomen and pelvis renal protocol shows hydronephrosis on the left side with a distal 6 mm kidney stone.  The patient has nearly completely passed the stone as it is very close to the ureterovesical junction.  She will be referred to urology, she will be given medications for home including Flomax, pain medication and antiemetic.  This was discussed with the patient and her husband at the bedside, they are in agreement with the plan.  She states she is asymptomatic at discharge  Final Clinical Impressions(s) / ED Diagnoses   Final diagnoses:  Kidney stone    ED Discharge Orders         Ordered    naproxen (NAPROSYN) 500 MG tablet  2 times daily with meals     04/22/18 1643    ondansetron (ZOFRAN ODT) 4 MG disintegrating tablet  Every 8 hours PRN     04/22/18 1643    oxyCODONE-acetaminophen  (PERCOCET) 5-325 MG  tablet  Every 6 hours PRN     04/22/18 1643           Noemi Chapel, MD 04/22/18 1644

## 2018-05-31 ENCOUNTER — Other Ambulatory Visit: Payer: Self-pay | Admitting: Nurse Practitioner

## 2018-05-31 DIAGNOSIS — M5136 Other intervertebral disc degeneration, lumbar region: Secondary | ICD-10-CM

## 2018-06-10 ENCOUNTER — Ambulatory Visit
Admission: RE | Admit: 2018-06-10 | Discharge: 2018-06-10 | Disposition: A | Discharge status: Home / Self Care | Payer: Medicare Other | Source: Ambulatory Visit | Attending: Nurse Practitioner | Admitting: Nurse Practitioner

## 2018-06-10 DIAGNOSIS — M5136 Other intervertebral disc degeneration, lumbar region: Secondary | ICD-10-CM

## 2018-06-10 MED ORDER — METHYLPREDNISOLONE ACETATE 40 MG/ML INJ SUSP (RADIOLOG
120.0000 mg | Freq: Once | INTRAMUSCULAR | Status: AC
  Administered 2018-06-10: 120 mg via EPIDURAL

## 2018-06-10 MED ORDER — IOPAMIDOL (ISOVUE-M 200) INJECTION 41%
1.0000 mL | Freq: Once | INTRAMUSCULAR | Status: AC
  Administered 2018-06-10: 1 mL via EPIDURAL

## 2018-06-11 ENCOUNTER — Other Ambulatory Visit (HOSPITAL_COMMUNITY): Payer: Self-pay | Admitting: Nurse Practitioner

## 2018-06-11 DIAGNOSIS — R1030 Lower abdominal pain, unspecified: Secondary | ICD-10-CM

## 2018-06-15 ENCOUNTER — Ambulatory Visit (HOSPITAL_COMMUNITY)
Admission: RE | Admit: 2018-06-15 | Discharge: 2018-06-15 | Disposition: A | Discharge status: Home / Self Care | Payer: Medicare Other | Source: Ambulatory Visit | Attending: Nurse Practitioner | Admitting: Nurse Practitioner

## 2018-06-15 DIAGNOSIS — R1030 Lower abdominal pain, unspecified: Secondary | ICD-10-CM | POA: Diagnosis present

## 2018-06-15 DIAGNOSIS — K8020 Calculus of gallbladder without cholecystitis without obstruction: Secondary | ICD-10-CM | POA: Insufficient documentation

## 2018-06-15 DIAGNOSIS — N200 Calculus of kidney: Secondary | ICD-10-CM | POA: Insufficient documentation

## 2018-07-14 ENCOUNTER — Other Ambulatory Visit: Payer: Self-pay | Admitting: Nurse Practitioner

## 2018-07-14 DIAGNOSIS — M79604 Pain in right leg: Secondary | ICD-10-CM

## 2018-07-14 DIAGNOSIS — M5136 Other intervertebral disc degeneration, lumbar region: Secondary | ICD-10-CM

## 2018-07-14 DIAGNOSIS — M79605 Pain in left leg: Principal | ICD-10-CM

## 2018-07-30 ENCOUNTER — Other Ambulatory Visit: Payer: Medicare Other

## 2018-08-12 ENCOUNTER — Ambulatory Visit
Admission: RE | Admit: 2018-08-12 | Discharge: 2018-08-12 | Disposition: A | Discharge status: Home / Self Care | Payer: Medicare Other | Source: Ambulatory Visit | Attending: Nurse Practitioner | Admitting: Nurse Practitioner

## 2018-08-12 ENCOUNTER — Other Ambulatory Visit: Payer: Self-pay | Admitting: Nurse Practitioner

## 2018-08-12 DIAGNOSIS — M79605 Pain in left leg: Principal | ICD-10-CM

## 2018-08-12 DIAGNOSIS — M5136 Other intervertebral disc degeneration, lumbar region: Secondary | ICD-10-CM

## 2018-08-12 DIAGNOSIS — M79604 Pain in right leg: Secondary | ICD-10-CM

## 2018-08-12 MED ORDER — METHYLPREDNISOLONE ACETATE 40 MG/ML INJ SUSP (RADIOLOG
120.0000 mg | Freq: Once | INTRAMUSCULAR | Status: AC
  Administered 2018-08-12: 120 mg via EPIDURAL

## 2018-08-12 MED ORDER — IOPAMIDOL (ISOVUE-M 200) INJECTION 41%
1.0000 mL | Freq: Once | INTRAMUSCULAR | Status: AC
  Administered 2018-08-12: 1 mL via EPIDURAL

## 2018-08-12 NOTE — Discharge Instructions (Signed)

## 2018-08-24 ENCOUNTER — Ambulatory Visit (HOSPITAL_COMMUNITY)
Admission: RE | Admit: 2018-08-24 | Discharge: 2018-08-24 | Disposition: A | Discharge status: Home / Self Care | Payer: Medicare Other | Source: Ambulatory Visit | Attending: Nurse Practitioner | Admitting: Nurse Practitioner

## 2018-08-24 ENCOUNTER — Other Ambulatory Visit (HOSPITAL_COMMUNITY): Payer: Self-pay | Admitting: Nurse Practitioner

## 2018-08-24 DIAGNOSIS — M79629 Pain in unspecified upper arm: Secondary | ICD-10-CM | POA: Insufficient documentation

## 2018-10-28 ENCOUNTER — Encounter: Payer: Medicare Other | Admitting: Adult Health

## 2018-11-03 ENCOUNTER — Telehealth: Payer: Self-pay | Admitting: *Deleted

## 2018-11-03 ENCOUNTER — Ambulatory Visit: Payer: Medicare Other | Admitting: Adult Health

## 2018-11-03 ENCOUNTER — Encounter: Payer: Self-pay | Admitting: Adult Health

## 2018-11-03 ENCOUNTER — Other Ambulatory Visit: Payer: Self-pay

## 2018-11-03 VITALS — BP 134/66 | HR 76 | Temp 98.2°F | Ht 62.0 in | Wt 121.5 lb

## 2018-11-03 DIAGNOSIS — R102 Pelvic and perineal pain: Secondary | ICD-10-CM | POA: Diagnosis not present

## 2018-11-03 DIAGNOSIS — N952 Postmenopausal atrophic vaginitis: Secondary | ICD-10-CM

## 2018-11-03 NOTE — Telephone Encounter (Signed)
Left message for pt to call before coming to today's appt. [place]

## 2018-11-03 NOTE — Telephone Encounter (Signed)
Spoke with pt to ask about today's appt. Pt is having heaviness and pressure in pelvic area. It's worse at times. No other symptoms. I spoke with [name] and she advised to come in for today's appt. I advised no visitors or children at today's appt. Also, pt hasn't come in contact with someone that has been confirmed or suspected of having Covid-19 nor is she experiencing any symptoms herself. Linda Stein

## 2018-11-03 NOTE — Progress Notes (Signed)
Patient ID: Linda Stein, female   DOB: 1934-04-01, 85 y.o.   MRN: [id number] History of Present Illness: Linda Stein is a 85 year old white female, married, PM in complaining of pelvic heaviness and pressure for >6 months now, seems to have started after kidney stone last year.Feels like when used to have period, and laying down with heating pad helps.  She had CT renal study 04/22/18 and had obstructing kidney stone on the left and non obstructing stone on the right. Then in November had abdominal US that showed gallstones.  PCP is Dr Karie Kirks.    Current Medications, Allergies, Past Medical History, Past Surgical History, Family History and Social History were reviewed in [employer] record.     Review of Systems: Has pelvic heaviness and pressure for >6 months, started after had kidney stone last year, around September, feels like when used to have period and laying down with heating pad helps. .  Denies any problems with urination or bowel movements Is not sexually active.    Physical Exam:BP 134/66 (BP Location: Right Arm, Patient Position: Sitting, Cuff Size: Normal)   Pulse 76   Temp 98.2 F (36.8 C)   Ht 5\' 2"  (1.575 m)   Wt 121 lb 8 oz (55.1 kg)   BMI 22.22 kg/m  General:  Well developed, well nourished, no acute distress Skin:  Warm and dry Neck:  Midline trachea, normal thyroid, good ROM, no lymphadenopathy Lungs; Clear to auscultation bilaterally Cardiovascular: Regular rate and rhythm Pelvic:  External genitalia is normal in appearance, no lesions.  The vagina is pale and atrophic, used small Pederson speculum. Urethra has no lesions or masses. The cervix is atrophic, and poorly seen.  Uterus is felt to be normal size, shape, and contour.  No adnexal masses or tenderness noted,but left side feels fuller.Bladder is non tender, no masses felt. She has good support, she had  C-section. Rectal: Good sphincter tone, no polyps, or hemorrhoids felt.  Hemoccult  negative(no charge on hemoccult) Psych:  No mood changes, alert and cooperative,seems happy Fall risk is low. PHQ 2 score is 0. Examination chaperoned by Levy Pupa LPN.  Will get [place] to assess uterus and ovaries.   Impression: 1. Pelvic pressure in female   2. Vaginal atrophy       Plan: Will get GYN [place] in about 2 weeks and will talk after that

## 2018-11-17 ENCOUNTER — Other Ambulatory Visit: Payer: Medicare Other

## 2018-12-13 ENCOUNTER — Other Ambulatory Visit: Payer: Medicare Other

## 2018-12-27 ENCOUNTER — Other Ambulatory Visit: Payer: Self-pay

## 2018-12-27 ENCOUNTER — Ambulatory Visit (INDEPENDENT_AMBULATORY_CARE_PROVIDER_SITE_OTHER): Payer: Medicare Other

## 2018-12-27 ENCOUNTER — Other Ambulatory Visit: Payer: Self-pay | Admitting: Adult Health

## 2018-12-27 DIAGNOSIS — R102 Pelvic and perineal pain: Secondary | ICD-10-CM

## 2018-12-27 NOTE — Progress Notes (Signed)
PELVIC US TA/TV:atropic heterogeneous anteverted uterus with mult small calcification,small amount of simple fluid within the endometrium,EEC 2.8 mm,normal ovaries bilat,ovaries appear mobile,some pelvic discomfort during ultrasound

## 2018-12-29 ENCOUNTER — Telehealth: Payer: Self-pay | Admitting: Adult Health

## 2018-12-29 NOTE — Telephone Encounter (Signed)
Pt would like to see if her results can be mailed out to her. Address in chart correct.

## 2018-12-29 NOTE — Telephone Encounter (Signed)
Pt aware that [place] looks normal, but atrophic, discussed trying vaginal estrogen, encouraged her to google it let me know.

## 2019-03-22 ENCOUNTER — Other Ambulatory Visit: Payer: Self-pay

## 2019-03-22 ENCOUNTER — Ambulatory Visit (INDEPENDENT_AMBULATORY_CARE_PROVIDER_SITE_OTHER): Payer: Medicare Other | Admitting: Podiatry

## 2019-03-22 ENCOUNTER — Encounter: Payer: Self-pay | Admitting: Podiatry

## 2019-03-22 VITALS — Temp 97.9°F

## 2019-03-22 DIAGNOSIS — Q828 Other specified congenital malformations of skin: Secondary | ICD-10-CM | POA: Diagnosis not present

## 2019-03-22 DIAGNOSIS — M779 Enthesopathy, unspecified: Secondary | ICD-10-CM

## 2019-03-22 DIAGNOSIS — M778 Other enthesopathies, not elsewhere classified: Secondary | ICD-10-CM

## 2019-03-23 ENCOUNTER — Encounter: Payer: Self-pay | Admitting: Podiatry

## 2019-03-23 NOTE — Progress Notes (Signed)
She presents today for follow-up of capsulitis to the left foot.  States that it started acting up again about a week or so ago I was just glad I had this shoe the last time she was seen was in 2019.  Objective: Vital signs are stable alert and oriented x3.  Pulses are palpable.  Mild hammertoe deformity second metatarsal phalangeal joint of the right foot reveals pain on palpation and end range of motion of the second metatarsal phalangeal joint.  She also has reactive hyperkeratotic lesion left foot.  Does not demonstrate any signs of skin breakdown.  Assessment: Recurrence of the second metatarsal phalangeal joint capsulitis right.  Porokeratosis.  Plan: After sterile Betadine skin prep I injected 10 mg Kenalog 5 mg Marcaine around the joint.  She tolerated procedure well without complications encouraged her to continue to wear the Darco shoe for period of time I debrided all reactive hyperkeratotic tissue.  She will notify us if this does not improve.

## 2019-04-28 ENCOUNTER — Ambulatory Visit: Payer: Medicare Other | Admitting: Podiatry

## 2019-05-20 ENCOUNTER — Other Ambulatory Visit: Payer: Self-pay

## 2019-05-20 DIAGNOSIS — Z20822 Contact with and (suspected) exposure to covid-19: Secondary | ICD-10-CM

## 2019-05-22 LAB — NOVEL CORONAVIRUS, NAA: SARS-CoV-2, NAA: NOT DETECTED

## 2019-05-31 ENCOUNTER — Telehealth: Payer: Self-pay

## 2019-05-31 NOTE — Telephone Encounter (Signed)
Called and informed patient that test for Covid 19 was NEGATIVE. Discussed signs and symptoms of Covid 19 : fever, chills, respiratory symptoms, cough, ENT symptoms, sore throat, SOB, muscle pain, diarrhea, headache, loss of taste/smell, close exposure to COVID-19 patient. Pt instructed to call PCP if they develop the above signs and sx. Pt also instructed to call 911 if having respiratory issues/distress. Discussed MyChart enrollment. Pt verbalized understanding.  

## 2019-08-12 HISTORY — PX: OTHER SURGICAL HISTORY: SHX169

## 2019-08-25 ENCOUNTER — Ambulatory Visit: Payer: Medicare Other | Admitting: Podiatry

## 2019-08-25 ENCOUNTER — Other Ambulatory Visit: Payer: Self-pay

## 2019-08-25 ENCOUNTER — Encounter: Payer: Self-pay | Admitting: Podiatry

## 2019-08-25 DIAGNOSIS — Q828 Other specified congenital malformations of skin: Secondary | ICD-10-CM

## 2019-08-25 DIAGNOSIS — M778 Other enthesopathies, not elsewhere classified: Secondary | ICD-10-CM

## 2019-08-28 NOTE — Progress Notes (Signed)
She presents today chief complaint of painful capsulitis of the second metatarsophalangeal joint and painful calluses.  Objective: Vital signs are stable she is alert and oriented x3 pulses are palpable. Reactive hyper keratomas to the plantar aspect of the bilateral foot and she has pain on end range of motion of the second metatarsophalangeal joints bilateral right greater than left.  Assessment: Capsulitis and reactive porokeratotic lesions.  Plan: Debridement of all reactive hyperkeratotic tissue

## 2019-10-19 ENCOUNTER — Telehealth: Payer: Self-pay

## 2019-10-19 NOTE — Telephone Encounter (Signed)
I spoke with pt, she states she is wanting to know cost of the callous trimming. I informed pt Dr. Elisha Ponder performed foot care maintenance of toenail trimming, and corn and callous trimming, I would transfer her to [facility] to discuss cost.

## 2019-10-19 NOTE — Telephone Encounter (Signed)
Pt called stating that she has seen Dr. Milinda Pointer in the past and he reccommended that she see Dr. Elisha Ponder next week. Pt would like information on what Dr. Elisha Ponder does and etc.

## 2019-10-25 ENCOUNTER — Ambulatory Visit: Payer: Medicare Other | Admitting: Podiatry

## 2019-11-13 IMAGING — XA Imaging study
1 series · 1 of 1 positions shown · non-contrast
Comparison: none

CLINICAL DATA: Lumbosacral spondylosis without myelopathy.
Multilevel spondylitic changes on recent MRI. Low back and bilateral
lower extremity pain.

[Series 1: ortho standard · 1 of 1 slices shown]
[im 1/1]
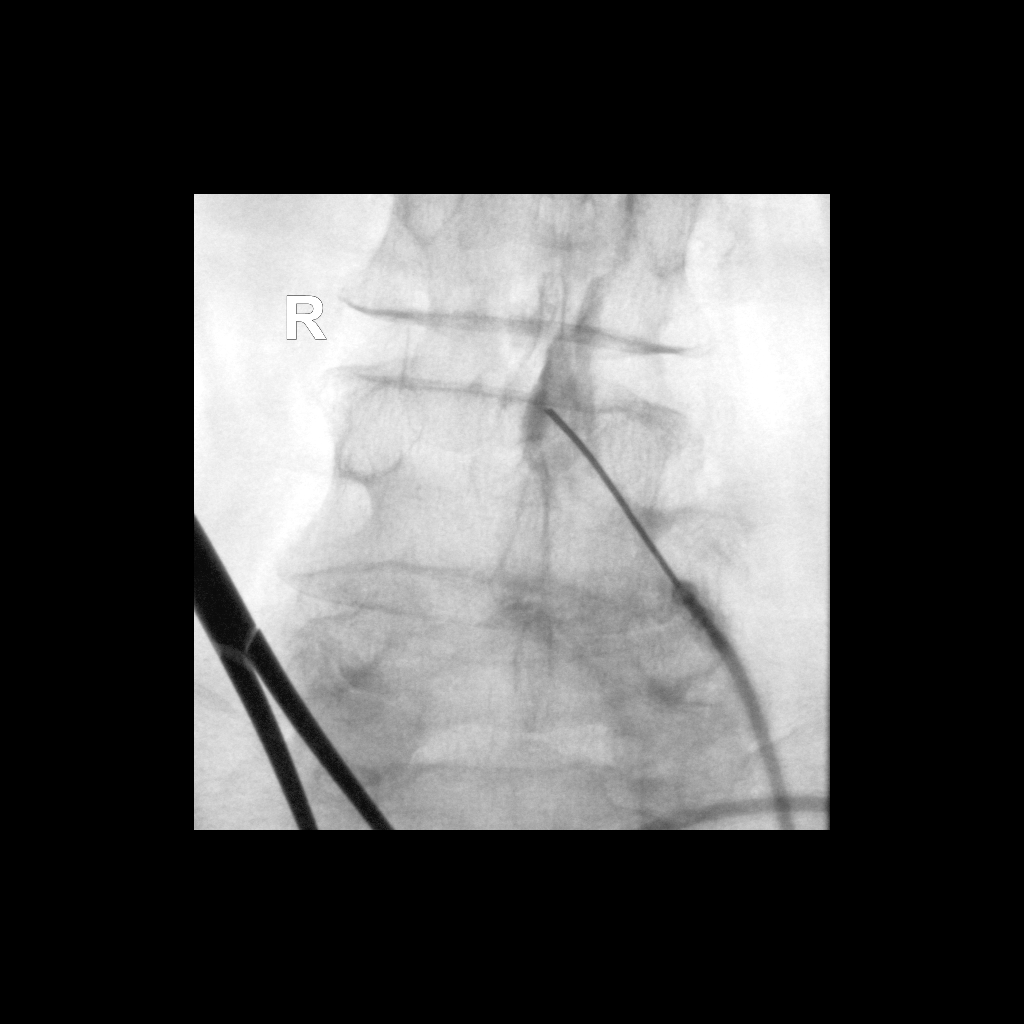

[1 of 1 positions shown; findings below may reference images not displayed]

PROCEDURE:
The procedure, risks, benefits, and alternatives were explained to
the patient. Questions regarding the procedure were encouraged and
answered. The patient understands and consents to the procedure.

LUMBAR EPIDURAL INJECTION:

An interlaminar approach was performed on the right at L3-4 directed
towards midline. The overlying skin was cleansed and anesthetized. A
20 gauge epidural needle was advanced using loss-of-resistance
technique.

DIAGNOSTIC EPIDURAL INJECTION:

Injection of Isovue-M 200 shows a good epidural pattern with spread
above and below the level of needle placement, primarily on the
right no vascular opacification is seen.

THERAPEUTIC EPIDURAL INJECTION:

120 mg of Depo-Medrol mixed with lidocaine 1% 5 mL were instilled.
The procedure was well-tolerated, and the patient was discharged
thirty minutes following the injection in good condition.

FLUOROSCOPY TIME:  46 seconds; 48 u7ymJ DAP

COMPLICATIONS:
None immediate
IMPRESSION: Technically successful epidural injection on the right at L3-4.

## 2020-01-15 IMAGING — XA Imaging study
2 series · 2 of 2 positions shown · non-contrast
Comparison: none

CLINICAL DATA: Lumbosacral spondylosis without myelopathy. 90%
improvement for 1 month following the epidural injection in [DATE].
Recurrent bilateral thigh pain, left worse than right.

[Series 1: ortho adipose · 1 of 1 slices shown (1 of 2)]
[im 1/1]
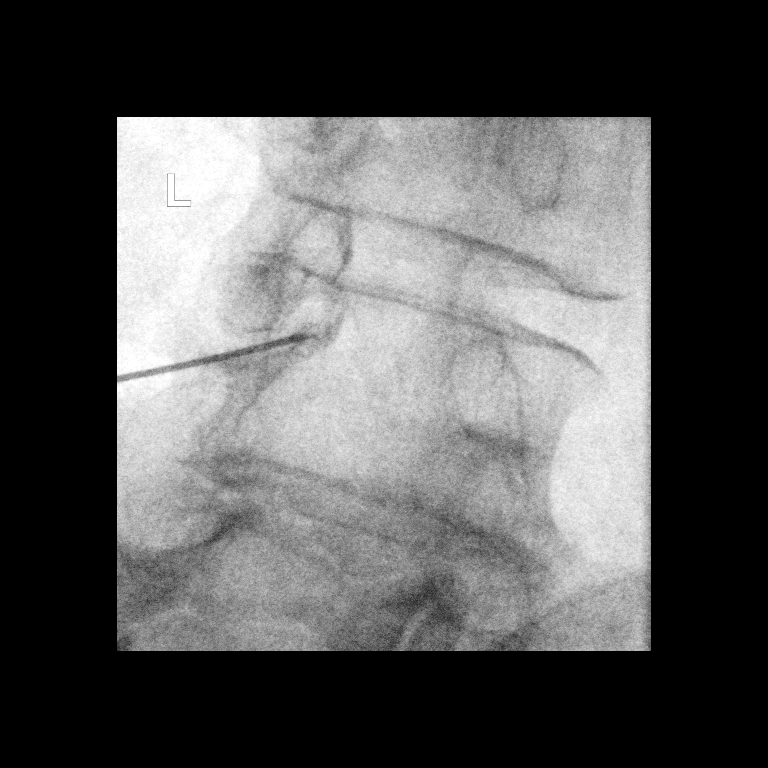

[Series 2: ortho adipose · 1 of 1 slices shown (2 of 2)]
[im 1/1]
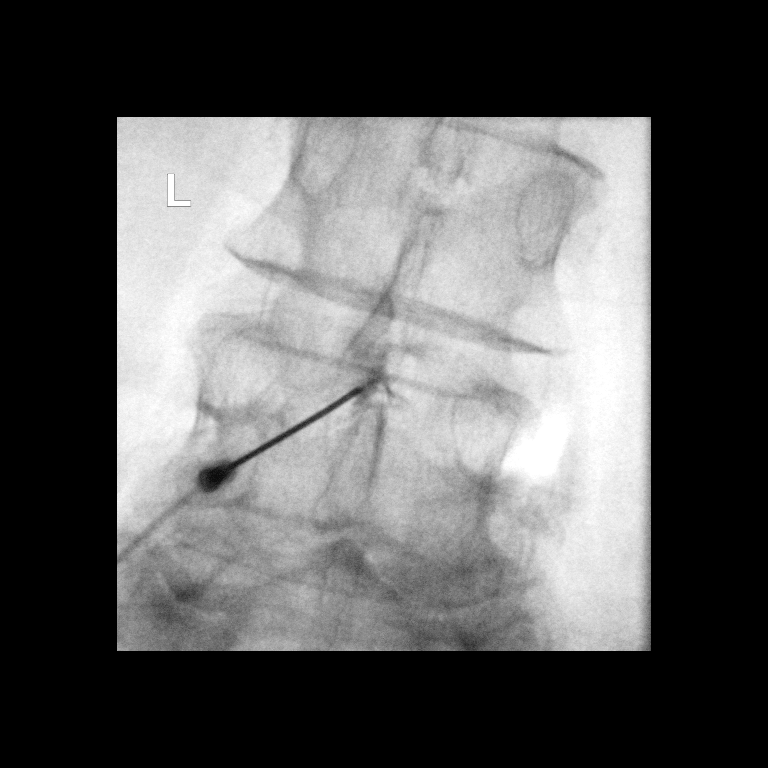

[2 of 2 positions shown; findings below may reference images not displayed]

FLUOROSCOPY TIME:  Radiation Exposure Index (as provided by the
fluoroscopic device): 14.35 microGray*m^2

Fluoroscopy Time (in minutes and seconds):  18 seconds

PROCEDURE:
The procedure, risks, benefits, and alternatives were explained to
the patient. Questions regarding the procedure were encouraged and
answered. The patient understands and consents to the procedure.

LUMBAR EPIDURAL INJECTION:

An interlaminar approach was performed on the left at L3-4. The
overlying skin was cleansed and anesthetized. A 3.5 inch 20 gauge
epidural needle was advanced using loss-of-resistance technique.

DIAGNOSTIC EPIDURAL INJECTION:

Injection of Isovue-M 200 shows a good epidural pattern with spread
above and below the level of needle placement, primarily on the
left. No vascular opacification is seen.

THERAPEUTIC EPIDURAL INJECTION:

120 mg of Depo-Medrol mixed with 3 mL of 1% lidocaine were
instilled. The procedure was well-tolerated, and the patient was
discharged thirty minutes following the injection in good condition.

COMPLICATIONS:
None
IMPRESSION: Technically successful lumbar interlaminar epidural injection on the
left at L3-4.

## 2020-02-07 ENCOUNTER — Ambulatory Visit: Payer: Medicare Other | Admitting: Podiatry

## 2020-06-20 ENCOUNTER — Other Ambulatory Visit: Payer: Self-pay

## 2020-06-20 ENCOUNTER — Ambulatory Visit: Payer: Medicare Other | Admitting: Podiatry

## 2020-06-20 DIAGNOSIS — L989 Disorder of the skin and subcutaneous tissue, unspecified: Secondary | ICD-10-CM | POA: Diagnosis not present

## 2020-06-20 NOTE — Progress Notes (Signed)
   Subjective: 86 y.o. female presenting to the office today for evaluation of right forefoot pain this been going on for several months.  Patient states is very symptomatic with walking and in certain shoes.  She states she believes she has a wart or a possible callus on the plantar aspect of the right forefoot.  It is very bothersome to apply any pressure to the area.  She presents for further treatment and evaluation   Past Medical History:  Diagnosis Date  . Atrophic vaginitis 06/07/2014  . Change in bowel habits   . H/O degenerative disc disease   . Irritable bowel syndrome   . Mucus in stool   . Vaginal irritation 06/07/2014     Objective:  Physical Exam General: Alert and oriented x3 in no acute distress  Dermatology: Hyperkeratotic lesion(s) present on the plantar aspect of the right forefoot. Pain on palpation with a central nucleated core noted. Skin is warm, dry and supple bilateral lower extremities. Negative for open lesions or macerations.  Vascular: Palpable pedal pulses bilaterally. No edema or erythema noted. Capillary refill within normal limits.  Neurological: Epicritic and protective threshold grossly intact bilaterally.   Musculoskeletal Exam: Pain on palpation at the keratotic lesion(s) noted. Range of motion within normal limits bilateral. Muscle strength 5/5 in all groups bilateral.  Assessment: 1.  Porokeratosis right forefoot   Plan of Care:  1. Patient evaluated 2. Excisional debridement of keratoic lesion(s) using a chisel blade was performed without incident. Salinocaine applied. 3. Dressed area with light dressing. 4.  Recommend corn callus remover daily as needed  5.  Patient is to return to the clinic PRN.   Edrick Kins, DPM [facility]  Dr. Edrick Kins, [address]                                        [place], [place] [address]                Office [phone]  Fax [phone]

## 2020-12-29 ENCOUNTER — Other Ambulatory Visit: Payer: Self-pay

## 2020-12-29 ENCOUNTER — Ambulatory Visit
Admission: EM | Admit: 2020-12-29 | Discharge: 2020-12-29 | Disposition: A | Discharge status: Home / Self Care | Payer: Medicare Other | Attending: Emergency Medicine | Admitting: Emergency Medicine

## 2020-12-29 DIAGNOSIS — N3001 Acute cystitis with hematuria: Secondary | ICD-10-CM | POA: Diagnosis not present

## 2020-12-29 LAB — POCT URINALYSIS DIP (MANUAL ENTRY)
Bilirubin, UA: NEGATIVE
Glucose, UA: NEGATIVE mg/dL
Ketones, POC UA: NEGATIVE mg/dL
Nitrite, UA: POSITIVE — AB
Protein Ur, POC: 100 mg/dL — AB
Spec Grav, UA: >=1.03 — AB (ref 1.010–1.025)
Urobilinogen, UA: 0.2 E.U./dL
pH, UA: 6 (ref 5.0–8.0)

## 2020-12-29 MED ORDER — CEPHALEXIN 500 MG PO CAPS: 500.0000 mg | ORAL_CAPSULE | Freq: Two times a day (BID) | ORAL | 0 refills | Status: AC

## 2020-12-29 NOTE — ED Provider Notes (Signed)
[facility]   CC: Burning with urination  SUBJECTIVE:  DER GAGLIANO is a 87 y.o. female who complains of urinary frequency, bladder pressure, and urgency x 1 day.  Patient denies a precipitating event, recent sexual encounter, excessive caffeine intake.  Has NOT tried OTC medications.  Denies aggravating factors.  Admits to similar symptoms in the past.  Denies fever, chills, nausea, vomiting, abdominal pain, flank pain, abnormal vaginal discharge or bleeding, hematuria.    LMP: No LMP recorded. Patient is postmenopausal.  ROS: As in HPI.  All other pertinent ROS negative.     Past Medical History:  Diagnosis Date  . Atrophic vaginitis 06/07/2014  . Change in bowel habits   . H/O degenerative disc disease   . Irritable bowel syndrome   . Mucus in stool   . Vaginal irritation 06/07/2014   Past Surgical History:  Procedure Laterality Date  . CESAREAN SECTION     Allergies  Allergen Reactions  . Sulfa Antibiotics Other (See Comments)    Sores in mouth.   No current facility-administered medications on file prior to encounter.   Current Outpatient Medications on File Prior to Encounter  Medication Sig Dispense Refill  . alendronate (FOSAMAX) 70 MG tablet Take 70 mg by mouth every Monday.   10  . Cholecalciferol (VITAMIN D3 PO) Take 1 capsule by mouth daily.    Marland Kitchen HYDROcodone-acetaminophen (NORCO/VICODIN) 5-325 MG tablet     . Omeprazole (PRILOSEC PO) Take by mouth daily.    . predniSONE (DELTASONE) 5 MG tablet 15 mg daily.      Social History   Socioeconomic History  . Marital status: Married    Spouse name: Not on file  . Number of children: Not on file  . Years of education: Not on file  . Highest education level: Not on file  Occupational History  . Not on file  Tobacco Use  . Smoking status: Former Smoker    Types: Cigarettes  . Smokeless tobacco: Never Used  Vaping Use  . Vaping Use: Never used  Substance and Sexual Activity  . Alcohol use: No   . Drug use: No  . Sexual activity: Not Currently    Birth control/protection: Post-menopausal  Other Topics Concern  . Not on file  Social History Narrative  . Not on file   Social Determinants of Health   Financial Resource Strain: Not on file  Food Insecurity: Not on file  Transportation Needs: Not on file  Physical Activity: Not on file  Stress: Not on file  Social Connections: Not on file  Intimate Partner Violence: Not on file   Family History  Problem Relation Age of Onset  . Other Mother        aneursym  . Stroke Mother   . Cancer Father        bone  . Other Son        hip replacement    OBJECTIVE:  Vitals:   12/29/20 1118  BP: 131/68  Pulse: 92  Resp: 18  Temp: 98.7 F (37.1 C)  SpO2: 95%   General appearance: AOx3 in no acute distress HEENT: NCAT.  Oropharynx clear.  Lungs: clear to auscultation bilaterally without adventitious breath sounds Heart: regular rate and rhythm.   Abdomen: soft; non-distended; no tenderness; bowel sounds present; no guarding Back: no CVA tenderness Extremities: no edema; symmetrical with no gross deformities Skin: warm and dry Neurologic: Ambulates from chair to exam table without difficulty Psychological: alert and cooperative; normal mood  and affect  Labs Reviewed  POCT URINALYSIS DIP (MANUAL ENTRY) - Abnormal; Notable for the following components:      Result Value   Clarity, UA cloudy (*)    Spec Grav, UA >=1.030 (*)    Blood, UA moderate (*)    Protein Ur, POC =100 (*)    Nitrite, UA Positive (*)    Leukocytes, UA Moderate (2+) (*)    All other components within normal limits    ASSESSMENT & PLAN:  1. Acute cystitis with hematuria     Meds ordered this encounter  Medications  . cephALEXin (KEFLEX) 500 MG capsule    Sig: Take 1 capsule (500 mg total) by mouth 2 (two) times daily for 10 days.    Dispense:  20 capsule    Refill:  0    Order Specific Question:   Supervising Provider    Answer:   Raylene Everts [[id number]]   Urine concerning for infection Urine culture sent.  We will call you with the results.   Push fluids and get plenty of rest.   Take antibiotic as directed and to completion Follow up with PCP if symptoms persists Return here or go to ER if you have any new or worsening symptoms such as fever, worsening abdominal pain, nausea/vomiting, flank pain, etc...  Outlined signs and symptoms indicating need for more acute intervention. Patient verbalized understanding. After Visit Summary given.     Lestine Box, PA-C 12/29/20 1158

## 2020-12-29 NOTE — Discharge Instructions (Signed)
Urine concerning for infection Urine culture sent.  We will call you with the results.   Push fluids and get plenty of rest.   Take antibiotic as directed and to completion Follow up with PCP if symptoms persists Return here or go to ER if you have any new or worsening symptoms such as fever, worsening abdominal pain, nausea/vomiting, flank pain, etc... 

## 2020-12-29 NOTE — ED Triage Notes (Signed)
Pt presents with c/o urinary frequency and pressure that began yesterday , had recent antibiotics for dental issues

## 2021-02-10 DIAGNOSIS — N390 Urinary tract infection, site not specified: Secondary | ICD-10-CM | POA: Insufficient documentation

## 2021-03-15 ENCOUNTER — Other Ambulatory Visit: Payer: Self-pay | Admitting: Urology

## 2021-03-15 NOTE — Patient Instructions (Addendum)
DUE TO COVID-19 ONLY ONE VISITOR IS ALLOWED TO COME WITH YOU AND STAY IN THE WAITING ROOM ONLY DURING PRE OP AND PROCEDURE.   **NO VISITORS ARE ALLOWED IN THE SHORT STAY AREA OR RECOVERY ROOM!!**  IF YOU WILL BE ADMITTED INTO THE HOSPITAL YOU ARE ALLOWED ONLY TWO SUPPORT PEOPLE DURING VISITATION HOURS ONLY (10AM -8PM)   The support person(s) may change daily. The support person(s) must pass our screening, gel in and out, and wear a mask at all times, including in the patient's room. Patients must also wear a mask when staff or their support person are in the room.  No visitors under the age of 45. Any visitor under the age of 75 must be accompanied by an adult.    COVID SWAB TESTING MUST BE COMPLETED ON:  03/18/21 **MUST PRESENT COMPLETED FORM AT TESTING SITE**    [facility] [place] (backside of the building) Open 8am-3pm. No appointment needed. You are not required to quarantine, however you are required to wear a well-fitted mask when you are out and around people not in your household.  Hand Hygiene often Do NOT share personal items Notify your provider if you are in close contact with someone who has COVID or you develop fever 100.4 or greater, new onset of sneezing, cough, sore throat, shortness of breath or body aches.       Your procedure is scheduled on: 03/20/21   Report to [facility] Main  Entrance    Report to admitting at 6:30 AM   Call this number if you have problems the morning of surgery [phone]   Do not eat food :After Midnight.   May have liquids until   5:30 AM day of surgery  CLEAR LIQUID DIET  Foods Allowed                                                                     Foods Excluded  Water, Black Coffee and tea, regular and decaf               liquids that you cannot  Plain Jell-O in any flavor  (No red)                                     see through such as: Fruit ices (not with fruit pulp)                                              milk, soups, orange juice              Iced Popsicles (No red)                                                 All solid food  Apple juices Sports drinks like Gatorade (No red) Lightly seasoned clear broth or consume(fat free) Sugar, honey syrup   Oral Hygiene is also important to reduce your risk of infection.                                    Remember - BRUSH YOUR TEETH THE MORNING OF SURGERY WITH YOUR REGULAR TOOTHPASTE   Take these medicines the morning of surgery with A SIP OF WATER: Omeprazole, Prednisone, Tylenol.                               You may not have any metal on your body including hair pins, jewelry, and body piercing             Do not wear make-up, lotions, powders, perfumes, or deodorant  Do not wear nail polish including gel and S&S, artificial/acrylic nails, or any other type of covering on natural nails including finger and toenails. If you have artificial nails, gel coating, etc. that needs to be removed by a nail salon please have this removed prior to surgery or surgery may need to be canceled/ delayed if the surgeon/ anesthesia feels like they are unable to be safely monitored.   Do not shave  48 hours prior to surgery.    Do not bring valuables to the hospital. [place].   Contacts, dentures or bridgework may not be worn into surgery.   Bring small overnight bag day of surgery.   Please read over the following fact sheets you were given: IF YOU HAVE QUESTIONS ABOUT YOUR PRE OP INSTRUCTIONS PLEASE CALL [phone]- [facility] - Preparing for Surgery Before surgery, you can play an important role.  Because skin is not sterile, your skin needs to be as free of germs as possible.  You can reduce the number of germs on your skin by washing with CHG (chlorahexidine gluconate) soap before surgery.  CHG is an antiseptic cleaner which kills germs and bonds with  the skin to continue killing germs even after washing. Please DO NOT use if you have an allergy to CHG or antibacterial soaps.  If your skin becomes reddened/irritated stop using the CHG and inform your nurse when you arrive at Short Stay. Do not shave (including legs and underarms) for at least 48 hours prior to the first CHG shower.  You may shave your face/neck.  Please follow these instructions carefully:  1.  Shower with CHG Soap the night before surgery and the  morning of surgery.  2.  If you choose to wash your hair, wash your hair first as usual with your normal  shampoo.  3.  After you shampoo, rinse your hair and body thoroughly to remove the shampoo.                             4.  Use CHG as you would any other liquid soap.  You can apply chg directly to the skin and wash.  Gently with a scrungie or clean washcloth.  5.  Apply the CHG Soap to your body ONLY FROM THE NECK DOWN.   Do   not use on face/ open  Wound or open sores. Avoid contact with eyes, ears mouth and   genitals (private parts).                       Wash face,  Genitals (private parts) with your normal soap.             6.  Wash thoroughly, paying special attention to the area where your    surgery  will be performed.  7.  Thoroughly rinse your body with warm water from the neck down.  8.  DO NOT shower/wash with your normal soap after using and rinsing off the CHG Soap.                9.  Pat yourself dry with a clean towel.            10.  Wear clean pajamas.            11.  Place clean sheets on your bed the night of your first shower and do not  sleep with pets. Day of Surgery : Do not apply any lotions/deodorants the morning of surgery.  Please wear clean clothes to the hospital/surgery center.  FAILURE TO FOLLOW THESE INSTRUCTIONS MAY RESULT IN THE CANCELLATION OF YOUR SURGERY  PATIENT SIGNATURE_________________________________  NURSE  SIGNATURE__________________________________  ________________________________________________________________________

## 2021-03-15 NOTE — Progress Notes (Signed)
Left voicemail with Dr. Zettie Pho scheduler for orders for PAT appointment scheduled 03/18/21.

## 2021-03-15 NOTE — Progress Notes (Addendum)
COVID Vaccine Completed: no Date COVID Vaccine completed: Has received booster: COVID vaccine manufacturer: Stella   Date of COVID positive in last 90 days: No  PCP - Research officer, trade union - N/a  Chest x-ray - N/a EKG - N/a Stress Test - N/a ECHO -  N/a Cardiac Cath - N/a Pacemaker/ICD device last checked: N/a Spinal Cord Stimulator: N/a  Sleep Study - N/a CPAP -   Fasting Blood Sugar - N/a Checks Blood Sugar _____ times a day  Blood Thinner Instructions: N/a  Aspirin Instructions: Last Dose:  Activity level: Can go up a flight of stairs and perform activities of daily living without stopping and without symptoms of chest pain or shortness of breath.    Anesthesia review:   Patient denies shortness of breath, fever, cough and chest pain at PAT appointment   Patient verbalized understanding of instructions that were given to them at the PAT appointment. Patient was also instructed that they will need to review over the PAT instructions again at home before surgery.

## 2021-03-18 ENCOUNTER — Other Ambulatory Visit: Payer: Self-pay

## 2021-03-18 ENCOUNTER — Encounter (HOSPITAL_COMMUNITY): Payer: Self-pay

## 2021-03-18 ENCOUNTER — Encounter (HOSPITAL_COMMUNITY)
Admission: RE | Admit: 2021-03-18 | Discharge: 2021-03-18 | Disposition: A | Discharge status: Home / Self Care | Payer: Medicare Other | Source: Ambulatory Visit | Attending: Urology | Admitting: Urology

## 2021-03-18 DIAGNOSIS — Z01812 Encounter for preprocedural laboratory examination: Secondary | ICD-10-CM | POA: Insufficient documentation

## 2021-03-18 HISTORY — DX: Personal history of urinary calculi: Z87.442

## 2021-03-18 LAB — CBC
HCT: 37 % (ref 36.0–46.0)
Hemoglobin: 11.8 g/dL — ABNORMAL LOW (ref 12.0–15.0)
MCH: 29.5 pg (ref 26.0–34.0)
MCHC: 31.9 g/dL (ref 30.0–36.0)
MCV: 92.5 fL (ref 80.0–100.0)
Platelets: 268 10*3/uL (ref 150–400)
RBC: 4 MIL/uL (ref 3.87–5.11)
RDW: 13.4 % (ref 11.5–15.5)
WBC: 7.7 10*3/uL (ref 4.0–10.5)
nRBC: 0 % (ref 0.0–0.2)

## 2021-03-19 ENCOUNTER — Other Ambulatory Visit: Payer: Self-pay | Admitting: Urology

## 2021-03-19 LAB — SARS CORONAVIRUS 2 (TAT 6-24 HRS): SARS Coronavirus 2: NEGATIVE

## 2021-03-19 NOTE — Anesthesia Preprocedure Evaluation (Addendum)
Anesthesia Evaluation  Patient identified by MRN, date of birth, ID band Patient awake    Reviewed: Allergy & Precautions, NPO status , Patient's Chart, lab work & pertinent test results  Airway Mallampati: II  TM Distance: >3 FB Neck ROM: Full    Dental  (+) Partial Upper, Partial Lower   Pulmonary former smoker,    Pulmonary exam normal breath sounds clear to auscultation       Cardiovascular negative cardio ROS Normal cardiovascular exam Rhythm:Regular Rate:Normal     Neuro/Psych negative neurological ROS  negative psych ROS   GI/Hepatic GERD  ,  Endo/Other  negative endocrine ROS  Renal/GU Renal disease (kidney stones)  negative genitourinary   Musculoskeletal negative musculoskeletal ROS (+)   Abdominal   Peds negative pediatric ROS (+)  Hematology negative hematology ROS (+)   Anesthesia Other Findings   Reproductive/Obstetrics negative OB ROS                            Anesthesia Physical Anesthesia Plan  ASA: 3  Anesthesia Plan: General   Post-op Pain Management:    Induction: Intravenous  PONV Risk Score and Plan: 3 and Treatment may vary due to age or medical condition and Ondansetron  Airway Management Planned:   Additional Equipment: None  Intra-op Plan:   Post-operative Plan: Extubation in OR  Informed Consent: I have reviewed the patients History and Physical, chart, labs and discussed the procedure including the risks, benefits and alternatives for the proposed anesthesia with the patient or authorized representative who has indicated his/her understanding and acceptance.     Dental advisory given  Plan Discussed with: CRNA and Anesthesiologist  Anesthesia Plan Comments:        Anesthesia Quick Evaluation

## 2021-03-20 ENCOUNTER — Encounter (HOSPITAL_COMMUNITY)
Admission: AD | Disposition: A | Discharge status: Home / Self Care | Payer: Self-pay | Source: Home / Self Care | Attending: Urology

## 2021-03-20 ENCOUNTER — Ambulatory Visit (HOSPITAL_COMMUNITY): Payer: Medicare Other | Admitting: Anesthesiology

## 2021-03-20 ENCOUNTER — Other Ambulatory Visit: Payer: Self-pay

## 2021-03-20 ENCOUNTER — Encounter (HOSPITAL_COMMUNITY): Payer: Self-pay | Admitting: Urology

## 2021-03-20 ENCOUNTER — Ambulatory Visit (HOSPITAL_COMMUNITY): Payer: Medicare Other

## 2021-03-20 ENCOUNTER — Inpatient Hospital Stay (HOSPITAL_COMMUNITY)
Admission: AD | Admit: 2021-03-20 | Discharge: 2021-03-23 | DRG: 660 | Disposition: A | Discharge status: Home / Self Care | Payer: Medicare Other | Attending: Urology | Admitting: Urology

## 2021-03-20 DIAGNOSIS — B9620 Unspecified Escherichia coli [E. coli] as the cause of diseases classified elsewhere: Secondary | ICD-10-CM | POA: Diagnosis present

## 2021-03-20 DIAGNOSIS — S50819A Abrasion of unspecified forearm, initial encounter: Secondary | ICD-10-CM | POA: Diagnosis present

## 2021-03-20 DIAGNOSIS — Z7952 Long term (current) use of systemic steroids: Secondary | ICD-10-CM | POA: Diagnosis not present

## 2021-03-20 DIAGNOSIS — N200 Calculus of kidney: Secondary | ICD-10-CM

## 2021-03-20 DIAGNOSIS — N390 Urinary tract infection, site not specified: Secondary | ICD-10-CM | POA: Diagnosis present

## 2021-03-20 DIAGNOSIS — Z1624 Resistance to multiple antibiotics: Secondary | ICD-10-CM | POA: Diagnosis present

## 2021-03-20 DIAGNOSIS — Z882 Allergy status to sulfonamides status: Secondary | ICD-10-CM | POA: Diagnosis not present

## 2021-03-20 DIAGNOSIS — Z87442 Personal history of urinary calculi: Secondary | ICD-10-CM | POA: Diagnosis not present

## 2021-03-20 DIAGNOSIS — Z20822 Contact with and (suspected) exposure to covid-19: Secondary | ICD-10-CM | POA: Diagnosis present

## 2021-03-20 DIAGNOSIS — Z87891 Personal history of nicotine dependence: Secondary | ICD-10-CM

## 2021-03-20 DIAGNOSIS — K589 Irritable bowel syndrome without diarrhea: Secondary | ICD-10-CM | POA: Diagnosis present

## 2021-03-20 DIAGNOSIS — Z79899 Other long term (current) drug therapy: Secondary | ICD-10-CM | POA: Diagnosis not present

## 2021-03-20 DIAGNOSIS — Z809 Family history of malignant neoplasm, unspecified: Secondary | ICD-10-CM

## 2021-03-20 DIAGNOSIS — Z634 Disappearance and death of family member: Secondary | ICD-10-CM | POA: Diagnosis not present

## 2021-03-20 DIAGNOSIS — Z823 Family history of stroke: Secondary | ICD-10-CM | POA: Diagnosis not present

## 2021-03-20 HISTORY — PX: NEPHROLITHOTOMY: SHX5134

## 2021-03-20 LAB — HEMOGLOBIN AND HEMATOCRIT, BLOOD
HCT: 36.7 % (ref 36.0–46.0)
Hemoglobin: 11.3 g/dL — ABNORMAL LOW (ref 12.0–15.0)

## 2021-03-20 LAB — SAMPLE TO BLOOD BANK

## 2021-03-20 SURGERY — NEPHROLITHOTOMY PERCUTANEOUS
Anesthesia: General | Laterality: Right

## 2021-03-20 MED ORDER — FENTANYL CITRATE (PF) 100 MCG/2ML IJ SOLN
25.0000 ug | INTRAMUSCULAR | Status: DC | PRN
  Administered 2021-03-20: 25 ug via INTRAVENOUS
  Administered 2021-03-20: 50 ug via INTRAVENOUS

## 2021-03-20 MED ORDER — SENNOSIDES-DOCUSATE SODIUM 8.6-50 MG PO TABS
1.0000 | ORAL_TABLET | Freq: Two times a day (BID) | ORAL | Status: DC
  Administered 2021-03-20 – 2021-03-23 (×5): 1 via ORAL
  Filled 2021-03-20 (×5): qty 1

## 2021-03-20 MED ORDER — DEXAMETHASONE SODIUM PHOSPHATE 10 MG/ML IJ SOLN
INTRAMUSCULAR | Status: AC
  Filled 2021-03-20: qty 1

## 2021-03-20 MED ORDER — FENTANYL CITRATE (PF) 100 MCG/2ML IJ SOLN
INTRAMUSCULAR | Status: AC
  Administered 2021-03-20: 25 ug via INTRAVENOUS
  Filled 2021-03-20: qty 2

## 2021-03-20 MED ORDER — ROCURONIUM BROMIDE 10 MG/ML (PF) SYRINGE
PREFILLED_SYRINGE | INTRAVENOUS | Status: AC
  Filled 2021-03-20: qty 10

## 2021-03-20 MED ORDER — LIDOCAINE HCL (CARDIAC) PF 100 MG/5ML IV SOSY
PREFILLED_SYRINGE | INTRAVENOUS | Status: DC | PRN
  Administered 2021-03-20: 60 mg via INTRAVENOUS

## 2021-03-20 MED ORDER — PHENYLEPHRINE HCL-NACL 20-0.9 MG/250ML-% IV SOLN
INTRAVENOUS | Status: DC | PRN
  Administered 2021-03-20: 15 ug/min via INTRAVENOUS

## 2021-03-20 MED ORDER — FENTANYL CITRATE (PF) 100 MCG/2ML IJ SOLN
INTRAMUSCULAR | Status: AC
  Filled 2021-03-20: qty 2

## 2021-03-20 MED ORDER — PIPERACILLIN-TAZOBACTAM 3.375 G IVPB 30 MIN
3.3750 g | INTRAVENOUS | Status: AC
  Administered 2021-03-20: 3.375 g via INTRAVENOUS
  Filled 2021-03-20 (×2): qty 50

## 2021-03-20 MED ORDER — PHENYLEPHRINE HCL (PRESSORS) 10 MG/ML IV SOLN
INTRAVENOUS | Status: AC
  Filled 2021-03-20: qty 1

## 2021-03-20 MED ORDER — LIDOCAINE 2% (20 MG/ML) 5 ML SYRINGE
INTRAMUSCULAR | Status: AC
  Filled 2021-03-20: qty 5

## 2021-03-20 MED ORDER — ACETAMINOPHEN 500 MG PO TABS
1000.0000 mg | ORAL_TABLET | Freq: Once | ORAL | Status: AC
  Administered 2021-03-20: 1000 mg via ORAL
  Filled 2021-03-20: qty 2

## 2021-03-20 MED ORDER — ORAL CARE MOUTH RINSE
15.0000 mL | Freq: Once | OROMUCOSAL | Status: AC
  Administered 2021-03-20: 15 mL via OROMUCOSAL

## 2021-03-20 MED ORDER — ROCURONIUM BROMIDE 100 MG/10ML IV SOLN
INTRAVENOUS | Status: DC | PRN
Start: 2021-03-20 — End: 2021-03-20
  Administered 2021-03-20: 60 mg via INTRAVENOUS

## 2021-03-20 MED ORDER — EPHEDRINE SULFATE 50 MG/ML IJ SOLN
INTRAMUSCULAR | Status: DC | PRN
  Administered 2021-03-20: 10 mg via INTRAVENOUS
  Administered 2021-03-20: 5 mg via INTRAVENOUS

## 2021-03-20 MED ORDER — ONDANSETRON HCL 4 MG/2ML IJ SOLN: 4.0000 mg | Freq: Once | INTRAMUSCULAR | Status: DC | PRN

## 2021-03-20 MED ORDER — DEXAMETHASONE SODIUM PHOSPHATE 10 MG/ML IJ SOLN
INTRAMUSCULAR | Status: DC | PRN
  Administered 2021-03-20: 10 mg via INTRAVENOUS

## 2021-03-20 MED ORDER — LACTATED RINGERS IV SOLN: INTRAVENOUS | Status: DC

## 2021-03-20 MED ORDER — EPHEDRINE 5 MG/ML INJ
INTRAVENOUS | Status: AC
  Filled 2021-03-20: qty 5

## 2021-03-20 MED ORDER — ONDANSETRON HCL 4 MG/2ML IJ SOLN
INTRAMUSCULAR | Status: AC
  Filled 2021-03-20: qty 2

## 2021-03-20 MED ORDER — GLYCOPYRROLATE 0.2 MG/ML IJ SOLN
INTRAMUSCULAR | Status: AC
  Filled 2021-03-20: qty 1

## 2021-03-20 MED ORDER — ACETAMINOPHEN 500 MG PO TABS
1000.0000 mg | ORAL_TABLET | Freq: Three times a day (TID) | ORAL | Status: DC
  Administered 2021-03-20 – 2021-03-23 (×8): 1000 mg via ORAL
  Filled 2021-03-20 (×8): qty 2

## 2021-03-20 MED ORDER — OXYCODONE HCL 5 MG PO TABS: 5.0000 mg | ORAL_TABLET | Freq: Once | ORAL | Status: DC | PRN

## 2021-03-20 MED ORDER — PANTOPRAZOLE SODIUM 40 MG PO TBEC
40.0000 mg | DELAYED_RELEASE_TABLET | Freq: Every day | ORAL | Status: DC
  Administered 2021-03-21 – 2021-03-23 (×2): 40 mg via ORAL
  Filled 2021-03-20 (×2): qty 1

## 2021-03-20 MED ORDER — PHENYLEPHRINE HCL (PRESSORS) 10 MG/ML IV SOLN
INTRAVENOUS | Status: DC | PRN
  Administered 2021-03-20: 40 ug via INTRAVENOUS
  Administered 2021-03-20 (×2): 80 ug via INTRAVENOUS
  Administered 2021-03-20: 120 ug via INTRAVENOUS
  Administered 2021-03-20: 80 ug via INTRAVENOUS

## 2021-03-20 MED ORDER — HYDROMORPHONE HCL 1 MG/ML IJ SOLN
0.5000 mg | INTRAMUSCULAR | Status: DC | PRN
  Administered 2021-03-20: 0.5 mg via INTRAVENOUS
  Administered 2021-03-20: 1 mg via INTRAVENOUS
  Administered 2021-03-20: 0.5 mg via INTRAVENOUS
  Administered 2021-03-22 (×2): 1 mg via INTRAVENOUS
  Filled 2021-03-20 (×5): qty 1

## 2021-03-20 MED ORDER — CHLORHEXIDINE GLUCONATE 0.12 % MT SOLN: 15.0000 mL | Freq: Once | OROMUCOSAL | Status: AC

## 2021-03-20 MED ORDER — OXYCODONE HCL 5 MG/5ML PO SOLN: 5.0000 mg | Freq: Once | ORAL | Status: DC | PRN

## 2021-03-20 MED ORDER — PANTOPRAZOLE SODIUM 40 MG PO TBEC: 40.0000 mg | DELAYED_RELEASE_TABLET | Freq: Every day | ORAL | Status: DC

## 2021-03-20 MED ORDER — IOHEXOL 300 MG/ML  SOLN
INTRAMUSCULAR | Status: DC | PRN
  Administered 2021-03-20: 100 mL

## 2021-03-20 MED ORDER — AMISULPRIDE (ANTIEMETIC) 5 MG/2ML IV SOLN: 10.0000 mg | Freq: Once | INTRAVENOUS | Status: DC | PRN

## 2021-03-20 MED ORDER — FENTANYL CITRATE (PF) 100 MCG/2ML IJ SOLN
INTRAMUSCULAR | Status: DC | PRN
  Administered 2021-03-20: 25 ug via INTRAVENOUS
  Administered 2021-03-20: 50 ug via INTRAVENOUS
  Administered 2021-03-20: 25 ug via INTRAVENOUS
  Administered 2021-03-20 (×2): 50 ug via INTRAVENOUS

## 2021-03-20 MED ORDER — PROPOFOL 10 MG/ML IV BOLUS
INTRAVENOUS | Status: AC
  Filled 2021-03-20: qty 20

## 2021-03-20 MED ORDER — GLYCOPYRROLATE 0.2 MG/ML IJ SOLN
INTRAMUSCULAR | Status: DC | PRN
  Administered 2021-03-20: .1 mg via INTRAVENOUS

## 2021-03-20 MED ORDER — SODIUM CHLORIDE 0.9 % IR SOLN
Status: DC | PRN
  Administered 2021-03-20: 18000 mL

## 2021-03-20 MED ORDER — ONDANSETRON HCL 4 MG/2ML IJ SOLN
INTRAMUSCULAR | Status: DC | PRN
  Administered 2021-03-20: 4 mg via INTRAVENOUS

## 2021-03-20 MED ORDER — OXYCODONE HCL 5 MG PO TABS
5.0000 mg | ORAL_TABLET | ORAL | Status: DC | PRN
  Administered 2021-03-20 – 2021-03-23 (×4): 5 mg via ORAL
  Filled 2021-03-20 (×4): qty 1

## 2021-03-20 MED ORDER — LACTATED RINGERS IV SOLN: INTRAVENOUS | Status: DC | PRN

## 2021-03-20 MED ORDER — PROPOFOL 10 MG/ML IV BOLUS
INTRAVENOUS | Status: DC | PRN
  Administered 2021-03-20: 100 mg via INTRAVENOUS
  Administered 2021-03-20: 20 mg via INTRAVENOUS
  Administered 2021-03-20: 30 mg via INTRAVENOUS

## 2021-03-20 MED ORDER — PREDNISONE 5 MG PO TABS
5.0000 mg | ORAL_TABLET | Freq: Every day | ORAL | Status: DC
  Administered 2021-03-21 – 2021-03-23 (×2): 5 mg via ORAL
  Filled 2021-03-20 (×2): qty 1

## 2021-03-20 MED ORDER — PHENYLEPHRINE 40 MCG/ML (10ML) SYRINGE FOR IV PUSH (FOR BLOOD PRESSURE SUPPORT)
PREFILLED_SYRINGE | INTRAVENOUS | Status: AC
  Filled 2021-03-20: qty 10

## 2021-03-20 MED ORDER — SUGAMMADEX SODIUM 200 MG/2ML IV SOLN
INTRAVENOUS | Status: DC | PRN
  Administered 2021-03-20: 110 mg via INTRAVENOUS

## 2021-03-20 SURGICAL SUPPLY — 72 items
AGENT HMST KT MTR STRL THRMB (HEMOSTASIS)
APL PRP STRL LF DISP 70% ISPRP (MISCELLANEOUS) ×1
APL SKNCLS STERI-STRIP NONHPOA (GAUZE/BANDAGES/DRESSINGS) ×2
BAG COUNTER SPONGE SURGICOUNT (BAG) IMPLANT
BAG DRN RND TRDRP ANRFLXCHMBR (UROLOGICAL SUPPLIES) ×1
BAG SPNG CNTER NS LX DISP (BAG)
BAG URINE DRAIN 2000ML AR STRL (UROLOGICAL SUPPLIES) ×1 IMPLANT
BAG URO CATCHER STRL LF (MISCELLANEOUS) ×1 IMPLANT
BASKET LASER NITINOL 1.9FR (BASKET) IMPLANT
BASKET ZERO TIP NITINOL 2.4FR (BASKET) IMPLANT
BENZOIN TINCTURE PRP APPL 2/3 (GAUZE/BANDAGES/DRESSINGS) ×3 IMPLANT
BLADE SURG 15 STRL LF DISP TIS (BLADE) ×1 IMPLANT
BLADE SURG 15 STRL SS (BLADE) ×2
BSKT STON RTRVL 120 1.9FR (BASKET)
BSKT STON RTRVL ZERO TP 2.4FR (BASKET)
CATH FOLEY 2W COUNCIL 20FR 5CC (CATHETERS) IMPLANT
CATH FOLEY 2WAY SLVR  5CC 16FR (CATHETERS) ×2
CATH FOLEY 2WAY SLVR 5CC 16FR (CATHETERS) ×1 IMPLANT
CATH INTERMIT  6FR 70CM (CATHETERS) ×1 IMPLANT
CATH MULTI PURPOSE 16FR DRAIN (CATHETERS) ×1 IMPLANT
CATH ROBINSON RED A/P 20FR (CATHETERS) IMPLANT
CATH ULTRATHANE 14FR (CATHETERS) IMPLANT
CATH UROLOGY TORQUE 40 (MISCELLANEOUS) IMPLANT
CATH X-FORCE N30 NEPHROSTOMY (TUBING) ×1 IMPLANT
CHLORAPREP W/TINT 26 (MISCELLANEOUS) ×3 IMPLANT
DRAPE C-ARM 42X120 X-RAY (DRAPES) ×2 IMPLANT
DRAPE LINGEMAN PERC (DRAPES) ×2 IMPLANT
DRAPE SHEET LG 3/4 BI-LAMINATE (DRAPES) ×2 IMPLANT
DRAPE SURG IRRIG POUCH 19X23 (DRAPES) ×2 IMPLANT
DRSG PAD ABDOMINAL 8X10 ST (GAUZE/BANDAGES/DRESSINGS) ×4 IMPLANT
DRSG TEGADERM 4X4.75 (GAUZE/BANDAGES/DRESSINGS) IMPLANT
DRSG TEGADERM 8X12 (GAUZE/BANDAGES/DRESSINGS) ×3 IMPLANT
GAUZE SPONGE 4X4 12PLY STRL (GAUZE/BANDAGES/DRESSINGS) ×2 IMPLANT
GLOVE SURG ENC TEXT LTX SZ7.5 (GLOVE) ×4 IMPLANT
GOWN STRL REUS W/TWL LRG LVL3 (GOWN DISPOSABLE) ×3 IMPLANT
GUIDEWIRE AMPLAZ .035X145 (WIRE) ×4 IMPLANT
GUIDEWIRE ANG ZIPWIRE 038X150 (WIRE) ×2 IMPLANT
GUIDEWIRE STR DUAL SENSOR (WIRE) ×1 IMPLANT
IV SET EXTENSION CATH 6 NF (IV SETS) ×1 IMPLANT
KIT BASIN OR (CUSTOM PROCEDURE TRAY) ×2 IMPLANT
KIT PROBE 340X3.4XDISP GRN (MISCELLANEOUS) IMPLANT
KIT PROBE TRILOGY 3.4X340 (MISCELLANEOUS)
KIT PROBE TRILOGY 3.9X350 (MISCELLANEOUS) ×1 IMPLANT
KIT TURNOVER KIT A (KITS) ×2 IMPLANT
LASER FIB FLEXIVA PULSE ID 365 (Laser) IMPLANT
MANIFOLD NEPTUNE II (INSTRUMENTS) ×2 IMPLANT
NDL TROCAR 18X15 ECHO (NEEDLE) IMPLANT
NDL TROCAR 18X20 (NEEDLE) IMPLANT
NEEDLE TROCAR 18X15 ECHO (NEEDLE) ×2 IMPLANT
NEEDLE TROCAR 18X20 (NEEDLE) IMPLANT
NS IRRIG 1000ML POUR BTL (IV SOLUTION) ×2 IMPLANT
PACK CYSTO (CUSTOM PROCEDURE TRAY) ×2 IMPLANT
SHEATH PEELAWAY SET 9 (SHEATH) ×1 IMPLANT
SPONGE T-LAP 4X18 ~~LOC~~+RFID (SPONGE) ×2 IMPLANT
SURGIFLO W/THROMBIN 8M KIT (HEMOSTASIS) IMPLANT
SUT SILK 0 FSL (SUTURE) ×2 IMPLANT
SUT SILK 2 0 30  PSL (SUTURE) ×2
SUT SILK 2 0 30 PSL (SUTURE) ×1 IMPLANT
SUT VIC AB 2-0 CT1 27 (SUTURE)
SUT VIC AB 2-0 CT1 TAPERPNT 27 (SUTURE) IMPLANT
SYR 10ML LL (SYRINGE) ×2 IMPLANT
SYR 20ML LL LF (SYRINGE) ×4 IMPLANT
SYR 50ML LL SCALE MARK (SYRINGE) ×2 IMPLANT
TOWEL OR 17X26 10 PK STRL BLUE (TOWEL DISPOSABLE) ×2 IMPLANT
TRACTIP FLEXIVA PULS ID 200XHI (Laser) IMPLANT
TRACTIP FLEXIVA PULSE ID 200 (Laser)
TRAY FOLEY MTR SLVR 16FR STAT (SET/KITS/TRAYS/PACK) ×2 IMPLANT
TUBE CONNECTING VINYL 14FR 30C (TUBING) ×1 IMPLANT
TUBING CONNECTING 10 (TUBING) ×5 IMPLANT
TUBING STONE CATCHER TRILOGY (MISCELLANEOUS) ×1 IMPLANT
TUBING UROLOGY SET (TUBING) ×1 IMPLANT
WATER STERILE IRR 1000ML POUR (IV SOLUTION) ×2 IMPLANT

## 2021-03-20 NOTE — Anesthesia Procedure Notes (Signed)
Procedure Name: Intubation Date/Time: 03/20/2021 3:59 AM Performed by: Adalberto Ill, CRNA Pre-anesthesia Checklist: Patient identified, Emergency Drugs available, Suction available, Patient being monitored and Timeout performed Patient Re-evaluated:Patient Re-evaluated prior to induction Oxygen Delivery Method: Circle system utilized Preoxygenation: Pre-oxygenation with 100% oxygen Induction Type: IV induction Ventilation: Mask ventilation without difficulty Laryngoscope Size: Miller and 2 Grade View: Grade I Tube type: Oral Tube size: 7.0 mm Number of attempts: 1 Placement Confirmation: ETT inserted through vocal cords under direct vision, positive ETCO2 and breath sounds checked- equal and bilateral Secured at: 20 cm Tube secured with: Tape Dental Injury: Teeth and Oropharynx as per pre-operative assessment

## 2021-03-20 NOTE — Anesthesia Postprocedure Evaluation (Signed)
Anesthesia Post Note  Patient: Linda Stein  Procedure(s) Performed: FIRST STAGE RIGHT NEPHROLITHOTOMY PERCUTANEOUS WITH  SURGEON ACCESS CYSTOSCOPY RIGHT RETROGRADE (Right)     Patient location during evaluation: PACU Anesthesia Type: General Level of consciousness: awake and alert Pain management: pain level controlled Vital Signs Assessment: post-procedure vital signs reviewed and stable Respiratory status: spontaneous breathing, nonlabored ventilation and respiratory function stable Cardiovascular status: blood pressure returned to baseline and stable Postop Assessment: no apparent nausea or vomiting Anesthetic complications: no   No notable events documented.  Last Vitals:  Vitals:   03/20/21 1200 03/20/21 1242  BP: (!) 165/69 (!) 159/71  Pulse: 75 79  Resp: 14 12  Temp: (!) 36.4 C   SpO2: 100% 97%    Last Pain:  Vitals:   03/20/21 1200  TempSrc:   PainSc: Asleep                 [name]

## 2021-03-20 NOTE — Op Note (Deleted)
  The note originally documented on this encounter has been moved the the encounter in which it belongs.  

## 2021-03-20 NOTE — Progress Notes (Signed)
Pt arrived to unit with two skin tears on right arm. Upon arrival skin tears were covered in gauze and then wrapped in coban. Dressing was removed and skin tears were cleaned with NS. Vaseline gauze applied to tears, topped with gauze and kerlix. Will continue to monitor.

## 2021-03-20 NOTE — OR Nursing (Signed)
Stone taken by Dr. Manny 

## 2021-03-20 NOTE — Discharge Instructions (Signed)
1 - You may have urinary urgency (bladder spasms) and bloody urine on / off with stent in place. This is normal. ° °2 - Call MD or go to ER for fever >102, severe pain / nausea / vomiting not relieved by medications, or acute change in medical status ° °

## 2021-03-20 NOTE — H&P (Signed)
Linda Stein is an 87 y.o. female.    Chief Complaint: Pre-OP RIGHT Percutaneous Nephrostolithotomy  HPI:   1 - Recurrent urolithiasis -  Pre 2022 MET x1  02/2021 - 2.5cm Rt renal pelvis + lower pole stones by CT on eval flank pain and complex UTI. This was not present just 2 years prior. 1000HU. Appears amenable to PCNL v. staged URS.   2 - Complex UTI - MDR e. coli by CX 2022 Sens augmentin, zosyn, amikacin, RES others   PMH sig for cesarean. NO CV disease / blood thinners. Her PCP Weber Cooks MD, pt's with [facility] in [place]. Her husband passed away aroun 26-Dec-2020 ans eh liveds independantly, in process of downsizing.   Today "Linda Stein " is seen to proceed with RIGHT percutaneous nephrostolithotomy for large renal stone in setting of recurrnet UTI. She has been on CX-specific ABX pre-op to reduce coloniation. NO interval fevers. Hgb 11.8   Past Medical History:  Diagnosis Date   Atrophic vaginitis 06/07/2014   Change in bowel habits    H/O degenerative disc disease    History of kidney stones    Irritable bowel syndrome    Mucus in stool    Vaginal irritation 06/07/2014    Past Surgical History:  Procedure Laterality Date   CESAREAN SECTION      Family History  Problem Relation Age of Onset   Other Mother        aneursym   Stroke Mother    Cancer Father        bone   Other Son        hip replacement   Social History:  reports that she has quit smoking. Her smoking use included cigarettes. She has never used smokeless tobacco. She reports that she does not drink alcohol and does not use drugs.  Allergies:  Allergies  Allergen Reactions   Sulfa Antibiotics Other (See Comments)    Sores in mouth.    Medications Prior to Admission  Medication Sig Dispense Refill   acetaminophen (TYLENOL) 500 MG tablet Take 1,000 mg by mouth every 6 (six) hours as needed for mild pain.     alendronate (FOSAMAX) 70 MG tablet Take 70 mg by mouth every Monday.   10   Cholecalciferol  (VITAMIN D3 PO) Take 5,000 Units by mouth daily.     Omeprazole (PRILOSEC PO) Take 20 mg by mouth daily.     predniSONE (DELTASONE) 5 MG tablet Take 5 mg by mouth daily with breakfast.      Results for orders placed or performed in visit on 03/19/21 (from the past 48 hour(s))  SARS Coronavirus 2 (TAT 6-24 hrs)     Status: None   Collection Time: 03/19/21 12:00 AM  Result Value Ref Range   SARS Coronavirus 2 RESULT: NEGATIVE     Comment: RESULT: NEGATIVESARS-CoV-2 INTERPRETATION:A NEGATIVE  test result means that SARS-CoV-2 RNA was not present in the specimen above the limit of detection of this test. This does not preclude a possible SARS-CoV-2 infection and should not be used as the  sole basis for patient management decisions. Negative results must be combined with clinical observations, patient history, and epidemiological information. Optimum specimen types and timing for peak viral levels during infections caused by SARS-CoV-2  have not been determined. Collection of multiple specimens or types of specimens may be necessary to detect virus. Improper specimen collection and handling, sequence variability under primers/probes, or organism present below the limit of detection may  lead to  false negative results. Positive and negative predictive values of testing are highly dependent on prevalence. False negative test results are more likely when prevalence of disease is high.The expected result is NEGATIVE.Fact S heet for  Healthcare Providers: LocalChronicle.no Sheet for Patients: SalonLookup.es Reference Range - Negative    No results found.  Review of Systems  Constitutional:  Negative for chills and fever.  All other systems reviewed and are negative.  Blood pressure (!) 166/63, pulse 81, temperature 98 F (36.7 C), temperature source Oral, resp. rate 15, SpO2 99 %. Physical Exam Vitals reviewed.  HENT:     Head:  Normocephalic.  Eyes:     Pupils: Pupils are equal, round, and reactive to light.  Cardiovascular:     Rate and Rhythm: Normal rate.  Pulmonary:     Effort: Pulmonary effort is normal.  Abdominal:     General: Abdomen is flat.  Musculoskeletal:        General: Normal range of motion.     Cervical back: Normal range of motion.  Neurological:     General: No focal deficit present.     Mental Status: She is alert.  Psychiatric:        Mood and Affect: Mood normal.     Assessment/Plan  Proceed as planned with RIGHT PCNL with access. Risks, benefits, alternatives, expected peri-op course discused preiouvlsy and reiterated today. We also discussed possible staged approach pending how much safe progress can occur today.   Alexis Frock, MD 03/20/2021, 6:56 AM

## 2021-03-20 NOTE — Brief Op Note (Signed)
03/20/2021  10:58 AM  PATIENT:  Linda Stein  87 y.o. female  PRE-OPERATIVE DIAGNOSIS:  RIGHT PARTIAL STAGHORN STONE  POST-OPERATIVE DIAGNOSIS:  RIGHT PARTIAL STAGHORN STONE  PROCEDURE:  Procedure(s): FIRST STAGE RIGHT NEPHROLITHOTOMY PERCUTANEOUS WITH  SURGEON ACCESS CYSTOSCOPY RIGHT RETROGRADE (Right)  SURGEON:  Surgeon(s) and Role:    * Alexis Frock, MD - Primary  PHYSICIAN ASSISTANT:   ASSISTANTS: none   ANESTHESIA:   general  EBL:  50 mL   BLOOD ADMINISTERED:none  DRAINS:  1 - foley to gravity, 2 - Rt nephrostomy to gravity    LOCAL MEDICATIONS USED:  NONE  SPECIMEN:  Source of Specimen:  Rt renal stone fragments  DISPOSITION OF SPECIMEN:   [facility] for compositional analysis  COUNTS:  YES  TOURNIQUET:  * No tourniquets in log *  DICTATION: .Other Dictation: Dictation Number [id number]  PLAN OF CARE: Admit to inpatient   PATIENT DISPOSITION:  PACU - hemodynamically stable.   Delay start of Pharmacological VTE agent (>24hrs) due to surgical blood loss or risk of bleeding: yes

## 2021-03-20 NOTE — Op Note (Signed)
NAMESEILA, FORTMAN MEDICAL RECORD NO: [id number] ACCOUNT NO: [id number] DATE OF BIRTH: 1934/05/21 FACILITY: [facility] LOCATION: [id number] PHYSICIAN: Alexis Frock, MD  Operative Report   DATE OF PROCEDURE: 03/20/2021  PREOPERATIVE DIAGNOSIS:  Large right renal stone approximately 3 cm with recurrent urinary tract infections.  PROCEDURE PERFORMED:  1.  Cystoscopy with right retrograde pyelogram interpretation. 2.  Insertion of right ureteral stent. 3.  Right percutaneous nephrostolithotomy stone greater than 2 cm. 4.  Right nephrostomy tube. 5.  Right antegrade nephrostogram.  ESTIMATED BLOOD LOSS:  123XX123 mL  COMPLICATIONS:  None.  SPECIMENS:  Right renal stone fragments for composition analysis.  FINDINGS: 1.  Very large volume, but relatively soft renal pelvis stone approximately 2.7 cm. 2.  High suspicion of lower pole stone and an acutely angled calyx not accessible with rigid technique today. 3.  Successful placement of the right nephroureteral stent and right nephrostomy tube.  INDICATIONS FOR PROCEDURE:  The patient is a very pleasant and quite vigorous 87 year old lady who was found on workup of recurrent multidrug-resistant urinary tract infections to have a large volume right urolithiasis with a partially obstructing right  renal pelvis stone that was greater than 2 cm as well as some additional lower pole stones.  This although clearly be likely nidus of her recurrent infections and certainly long-term risk of renal demise given the partial obstruction.  After discussion  of management including surveillance versus multistage ureteroscopy versus percutaneous nephrostolithotomy with latter being favored with a stone volume and her anatomy, understanding that it too may require multistage, she wished to proceed.  Informed  consent was obtained and placed in medical record.  DESCRIPTION OF PROCEDURE:  The patient being verified, procedure being right first stage percutaneous  nephrostolithotomy was confirmed.  Procedure timeout was performed.  Intravenous antibiotics administered.  General endotracheal anesthesia induced.   The patient was initially placed into the low lithotomy position.  A sterile field was created, prepped and draped patient's vagina, introitus, and proximal thigh using iodine.  Cystourethroscopy was performed using 21-French rigid cystoscope with offset  lens.  Inspection of urinary bladder with no diverticula, calcifications or no prior lesions.  The right ureteral orifice was cannulated with a 6-French Foley catheter and right retrograde pyelogram was obtained.  Right retrograde pyelogram revealed single right ureter, single system right kidney.  Large filling defect in the renal pelvis consistent with known stone.  There was a large upper pole selective hydronephrosis consistent with partial renal obstruction.   A 0.038 ZIPwire was advanced to the level of upper pole, open-ended catheter was advanced acting as an externalized stent.  A 16-French Foley catheter was placed to straight drain, 10 mL water in the balloon and the stent was fashioned through this with  IV extension tubing.  Next, the patient was repositioned into a prone position, pulling 10 degrees table flexion to better increase the space between the 12th rib, iliac crest, axillary rolls, chest rolls, prone view, padding of her knees and ankles was  very carefully performed and the externalized right stent was then put into the right flank.  Prep site was prepped using chlorhexidine gluconate.  Attention was directed at a percutaneous access to the kidney using retrograde filling from the right  kidney, a suitable calyx lower mid lateral was chosen.  This appeared to have good angulation to the area of stone, but avoid supracostal access using bull's eye technique at 15 degrees off of vertical.  This lower mid calyx was cannulated.  The  inferolateral access, a 0.038 ZIPwire was then advanced  to the right renal pelvis over which a KMP type catheter was advanced and then guiding the ZIPwire down to the level of the ureter, which was then exchanged for a Super Stiff wire via the KMP  catheter.  Skin incision was then made at the same site approximately 1.5 cm.  A small caliber access sheath was then placed in antegrade fashion using fluoroscopic guidance, at the level of proximal ureter, the ZIPwire was once again advanced and  exchanged for a second Super Stiff wire via the KMP catheter having, obtained Super Stiff access x2 to the level of the bladder.  Percutaneous drape was applied and the 30-French balloon dilation apparatus was carefully advanced across  this inflated to a pressure of 20 atmospheres.  The sheath was very carefully advanced using fluoroscopic guidance.  Next, rigid nephroscopy was performed.  This revealed excellent sheath placement into the lower mid calyx as desired.  Stone was clearly  visible in the renal pelvis.  Angulation appeared favorable for using the Trilogy ultrasound pneumatic device.  This was then employed.  The stone was fortunately quite amenable to this technique.  The large renal pelvis stone was completely ablated.   Additional flexible nephroscopy was performed using a 16-French flexible cystoscope and the dilated upper pole calyx area was inspected as well as the upper mid pole.  With the sheath in place angulation was not favorable for accessing the extreme lower  pole calyx, which was felt to likely harbor additional stone volume based on her preoperative imaging.  It was clearly felt then that was advantageous way to proceed would be to conclude the surgery today and perform a second stage in several days as  without the sheath in place, angulation would likely be favorable to her stone free and further inspect her lower pole.  A sensor wire was advanced to the level of the upper pole.  A 16-French nephrostomy tube was carefully advanced using  fluoroscopic  guidance at this location and coiled in place.  The KMP catheter was once again advanced down one of the Super Stiff wires that she has an externalized nephroureteral stent.  The Super Stiff wire was removed.  The sheath was removed.  The externalized  nephroureteral stent was sutured in place with interrupted silk as was the nephrostomy tube.  A percutaneous dressing was applied, 4 x 4 and Tegaderm.  The initially placed, externalized ureteral stent was removed.  Foley catheter kept in place.  The  procedure was terminated.  The patient tolerated the procedure well.  No immediate complications.  The patient was taken to postanesthesia care in stable condition.  PLAN:  For inpatient admission and likely second stage procedure day after tomorrow pending her clinical status and her goals of therapy.   PUS D: 03/20/2021 11:11:05 am T: 03/20/2021 1:31:00 pm  JOB: [id number]

## 2021-03-20 NOTE — Transfer of Care (Signed)
Immediate Anesthesia Transfer of Care Note  Patient: Linda Stein  Procedure(s) Performed: FIRST STAGE RIGHT NEPHROLITHOTOMY PERCUTANEOUS WITH  SURGEON ACCESS CYSTOSCOPY RIGHT RETROGRADE (Right)  Patient Location: PACU  Anesthesia Type:General  Level of Consciousness: awake and sedated  Airway & Oxygen Therapy: Patient Spontanous Breathing and Patient connected to face mask oxygen  Post-op Assessment: Report given to RN and Post -op Vital signs reviewed and stable  Post vital signs: Reviewed and stable  Last Vitals:  Vitals Value Taken Time  BP 168/74 03/20/21 1108  Temp    Pulse 77 03/20/21 1110  Resp 16 03/20/21 1110  SpO2 100 % 03/20/21 1110  Vitals shown include unvalidated device data.  Last Pain:  Vitals:   03/20/21 0642  TempSrc: Oral         Complications: No notable events documented.

## 2021-03-21 ENCOUNTER — Encounter (HOSPITAL_COMMUNITY): Payer: Self-pay | Admitting: Urology

## 2021-03-21 LAB — HEMOGLOBIN AND HEMATOCRIT, BLOOD
HCT: 32.5 % — ABNORMAL LOW (ref 36.0–46.0)
Hemoglobin: 10.4 g/dL — ABNORMAL LOW (ref 12.0–15.0)

## 2021-03-21 LAB — BASIC METABOLIC PANEL WITH GFR
Anion gap: 6 (ref 5–15)
BUN: 18 mg/dL (ref 8–23)
CO2: 25 mmol/L (ref 22–32)
Calcium: 8.9 mg/dL (ref 8.9–10.3)
Chloride: 105 mmol/L (ref 98–111)
Creatinine, Ser: 0.67 mg/dL (ref 0.44–1.00)
GFR, Estimated: >60 mL/min
Glucose, Bld: 124 mg/dL — ABNORMAL HIGH (ref 70–99)
Potassium: 4.5 mmol/L (ref 3.5–5.1)
Sodium: 136 mmol/L (ref 135–145)

## 2021-03-21 MED ORDER — PIPERACILLIN-TAZOBACTAM 3.375 G IVPB 30 MIN
3.3750 g | Freq: Once | INTRAVENOUS | Status: AC
  Administered 2021-03-22: 3.375 g via INTRAVENOUS
  Filled 2021-03-21 (×2): qty 50

## 2021-03-21 MED ORDER — CHLORHEXIDINE GLUCONATE CLOTH 2 % EX PADS
6.0000 | MEDICATED_PAD | Freq: Every day | CUTANEOUS | Status: DC
  Administered 2021-03-21 – 2021-03-23 (×3): 6 via TOPICAL

## 2021-03-21 NOTE — Progress Notes (Signed)
1 Day Post-Op   Subjective/Chief Complaint:  1 - Large Volume RIGHT Renal Stones - s/p first stage percutaneous nephrostolithotomy 03/20/10 for large volume multifocal renal stone in setting of obstruction and recurrent UTI. Estimated 70% stone volume removed (mostly renal pelvis), but unable to visualize stone in autely angled calyces.   Today "Anova" is stable. Hgb 10.4, Cr <1, no fevers.    Objective: Vital signs in last 24 hours: Temp:  [97.5 F (36.4 C)-98.3 F (36.8 C)] 98.2 F (36.8 C) (08/11 0249) Pulse Rate:  [69-82] 69 (08/11 0249) Resp:  [10-18] 18 (08/11 0249) BP: (133-168)/(59-74) 136/61 (08/11 0249) SpO2:  [87 %-100 %] 98 % (08/11 0249) Weight:  [54.5 kg] 54.5 kg (08/10 1326) Last BM Date: 03/19/21  Intake/Output from previous day: 08/10 0701 - 08/11 0700 In: 290 [P.O.:240; IV Piggyback:50] Out: 1200 [Urine:1150; Blood:50] Intake/Output this shift: No intake/output data recorded.  EXAM: NAD, AOx3, at baseline. Son at bedside.  Non-labored breathing on RA.  RRR NO r/g Rt flank site w/o ecchymoses / hematomas. Rt neph tube c/d/I with pink urine. Foely in place with light pink urne SCD's in place  Lab Results:  Recent Labs    03/18/21 1440 03/20/21 1135 03/21/21 0435  WBC 7.7  --   --   HGB 11.8* 11.3* 10.4*  HCT 37.0 36.7 32.5*  PLT 268  --   --    BMET Recent Labs    03/21/21 0435  NA 136  K 4.5  CL 105  CO2 25  GLUCOSE 124*  BUN 18  CREATININE 0.67  CALCIUM 8.9   PT/INR No results for input(s): LABPROT, INR in the last 72 hours. ABG No results for input(s): PHART, HCO3 in the last 72 hours.  Invalid input(s): PCO2, PO2  Studies/Results: DG C-Arm 1-60 Min-No Report  Result Date: 03/20/2021 Fluoroscopy was utilized by the requesting physician.  No radiographic interpretation.   DG C-Arm 1-60 Min-No Report  Result Date: 03/20/2021 Fluoroscopy was utilized by the requesting physician.  No radiographic interpretation.     Anti-infectives: Anti-infectives (From admission, onward)    Start     Dose/Rate Route Frequency Ordered Stop   03/20/21 0645  piperacillin-tazobactam (ZOSYN) IVPB 3.375 g        3.375 g 100 mL/hr over 30 Minutes Intravenous 30 min pre-op 03/20/21 [id number] 03/20/21 0903       Assessment/Plan:  1 - Large Volume RIGHT Renal Stones - doing well POD 1 after 1st stage PCNL for large voluem Rt renal stone. Rec proceed with 2nd stage surgery tomorrow with goal of stone free, angulation should be less problematic due to techinical advantages at 2nd stage (no access sheath), she is in complete agreeement.   Continue 1/2 MIVF, NPO 7 AM tomorrow for surgery likely mid afternoon.    Alexis Frock 03/21/2021

## 2021-03-22 ENCOUNTER — Encounter (HOSPITAL_COMMUNITY): Payer: Self-pay | Admitting: Urology

## 2021-03-22 ENCOUNTER — Inpatient Hospital Stay (HOSPITAL_COMMUNITY): Payer: Medicare Other

## 2021-03-22 ENCOUNTER — Inpatient Hospital Stay (HOSPITAL_COMMUNITY): Payer: Medicare Other | Admitting: Certified Registered"

## 2021-03-22 ENCOUNTER — Encounter (HOSPITAL_COMMUNITY)
Admission: AD | Disposition: A | Discharge status: Home / Self Care | Payer: Self-pay | Source: Home / Self Care | Attending: Urology

## 2021-03-22 HISTORY — PX: NEPHROLITHOTOMY: SHX5134

## 2021-03-22 SURGERY — NEPHROLITHOTOMY PERCUTANEOUS
Anesthesia: General | Site: Back | Laterality: Right

## 2021-03-22 MED ORDER — ONDANSETRON HCL 4 MG/2ML IJ SOLN: 4.0000 mg | Freq: Once | INTRAMUSCULAR | Status: DC | PRN

## 2021-03-22 MED ORDER — ONDANSETRON HCL 4 MG/2ML IJ SOLN
INTRAMUSCULAR | Status: DC | PRN
  Administered 2021-03-22: 4 mg via INTRAVENOUS

## 2021-03-22 MED ORDER — PHENOL 1.4 % MT LIQD
1.0000 | OROMUCOSAL | Status: DC | PRN
  Filled 2021-03-22: qty 177

## 2021-03-22 MED ORDER — DEXAMETHASONE SODIUM PHOSPHATE 10 MG/ML IJ SOLN
INTRAMUSCULAR | Status: DC | PRN
  Administered 2021-03-22: 4 mg via INTRAVENOUS

## 2021-03-22 MED ORDER — SODIUM CHLORIDE 0.9 % IR SOLN
Status: DC | PRN
  Administered 2021-03-22 (×2): 3000 mL

## 2021-03-22 MED ORDER — LACTATED RINGERS IV SOLN: INTRAVENOUS | Status: DC

## 2021-03-22 MED ORDER — CHLORHEXIDINE GLUCONATE 0.12 % MT SOLN
15.0000 mL | Freq: Once | OROMUCOSAL | Status: AC
  Administered 2021-03-22: 15 mL via OROMUCOSAL

## 2021-03-22 MED ORDER — FENTANYL CITRATE (PF) 100 MCG/2ML IJ SOLN
INTRAMUSCULAR | Status: DC | PRN
  Administered 2021-03-22 (×2): 50 ug via INTRAVENOUS

## 2021-03-22 MED ORDER — ONDANSETRON HCL 4 MG/2ML IJ SOLN
INTRAMUSCULAR | Status: AC
  Filled 2021-03-22: qty 2

## 2021-03-22 MED ORDER — LIDOCAINE 2% (20 MG/ML) 5 ML SYRINGE
INTRAMUSCULAR | Status: DC | PRN
  Administered 2021-03-22: 40 mg via INTRAVENOUS

## 2021-03-22 MED ORDER — DEXAMETHASONE SODIUM PHOSPHATE 10 MG/ML IJ SOLN
INTRAMUSCULAR | Status: AC
  Filled 2021-03-22: qty 1

## 2021-03-22 MED ORDER — PROPOFOL 10 MG/ML IV BOLUS
INTRAVENOUS | Status: DC | PRN
  Administered 2021-03-22: 100 mg via INTRAVENOUS

## 2021-03-22 MED ORDER — SENNOSIDES-DOCUSATE SODIUM 8.6-50 MG PO TABS: 1.0000 | ORAL_TABLET | Freq: Two times a day (BID) | ORAL | 0 refills | Status: DC

## 2021-03-22 MED ORDER — ROCURONIUM BROMIDE 10 MG/ML (PF) SYRINGE
PREFILLED_SYRINGE | INTRAVENOUS | Status: DC | PRN
  Administered 2021-03-22: 50 mg via INTRAVENOUS

## 2021-03-22 MED ORDER — TRAMADOL HCL 50 MG PO TABS: 50.0000 mg | ORAL_TABLET | Freq: Four times a day (QID) | ORAL | 0 refills | Status: DC | PRN

## 2021-03-22 MED ORDER — SODIUM CHLORIDE 0.9 % IV SOLN
INTRAVENOUS | Status: DC | PRN
  Administered 2021-03-22: 10 mL

## 2021-03-22 MED ORDER — PROPOFOL 10 MG/ML IV BOLUS
INTRAVENOUS | Status: AC
  Filled 2021-03-22: qty 20

## 2021-03-22 MED ORDER — FENTANYL CITRATE (PF) 100 MCG/2ML IJ SOLN
25.0000 ug | INTRAMUSCULAR | Status: DC | PRN
  Administered 2021-03-22: 25 ug via INTRAVENOUS

## 2021-03-22 MED ORDER — LIDOCAINE 2% (20 MG/ML) 5 ML SYRINGE
INTRAMUSCULAR | Status: AC
  Filled 2021-03-22: qty 5

## 2021-03-22 MED ORDER — SUGAMMADEX SODIUM 200 MG/2ML IV SOLN
INTRAVENOUS | Status: DC | PRN
  Administered 2021-03-22: 109 mg via INTRAVENOUS

## 2021-03-22 MED ORDER — PHENYLEPHRINE 40 MCG/ML (10ML) SYRINGE FOR IV PUSH (FOR BLOOD PRESSURE SUPPORT)
PREFILLED_SYRINGE | INTRAVENOUS | Status: DC | PRN
  Administered 2021-03-22: 80 ug via INTRAVENOUS
  Administered 2021-03-22: 40 ug via INTRAVENOUS
  Administered 2021-03-22 (×2): 80 ug via INTRAVENOUS

## 2021-03-22 MED ORDER — ROCURONIUM BROMIDE 10 MG/ML (PF) SYRINGE
PREFILLED_SYRINGE | INTRAVENOUS | Status: AC
  Filled 2021-03-22: qty 10

## 2021-03-22 MED ORDER — FENTANYL CITRATE (PF) 100 MCG/2ML IJ SOLN
INTRAMUSCULAR | Status: AC
  Filled 2021-03-22: qty 2

## 2021-03-22 MED ORDER — AMOXICILLIN-POT CLAVULANATE 500-125 MG PO TABS: 1.0000 | ORAL_TABLET | Freq: Two times a day (BID) | ORAL | 0 refills | Status: AC

## 2021-03-22 SURGICAL SUPPLY — 76 items
AGENT HMST KT MTR STRL THRMB (HEMOSTASIS)
APL PRP STRL LF DISP 70% ISPRP (MISCELLANEOUS) ×2
APL SKNCLS STERI-STRIP NONHPOA (GAUZE/BANDAGES/DRESSINGS) ×1
BAG COUNTER SPONGE SURGICOUNT (BAG) IMPLANT
BAG DRN RND TRDRP ANRFLXCHMBR (UROLOGICAL SUPPLIES)
BAG SPNG CNTER NS LX DISP (BAG)
BAG SURGICOUNT SPONGE COUNTING (BAG)
BAG URINE DRAIN 2000ML AR STRL (UROLOGICAL SUPPLIES) IMPLANT
BAG URO CATCHER STRL LF (MISCELLANEOUS) IMPLANT
BASKET LASER NITINOL 1.9FR (BASKET) IMPLANT
BASKET ZERO TIP NITINOL 2.4FR (BASKET) IMPLANT
BENZOIN TINCTURE PRP APPL 2/3 (GAUZE/BANDAGES/DRESSINGS) ×3 IMPLANT
BLADE SURG 15 STRL LF DISP TIS (BLADE) ×1 IMPLANT
BLADE SURG 15 STRL SS (BLADE) ×3
BSKT STON RTRVL 120 1.9FR (BASKET)
BSKT STON RTRVL ZERO TP 2.4FR (BASKET)
CATH FOLEY 2W COUNCIL 20FR 5CC (CATHETERS) IMPLANT
CATH FOLEY 2WAY SLVR  5CC 16FR (CATHETERS) ×3
CATH FOLEY 2WAY SLVR 5CC 16FR (CATHETERS) ×1 IMPLANT
CATH INTERMIT  6FR 70CM (CATHETERS) IMPLANT
CATH MULTI PURPOSE 16FR DRAIN (CATHETERS) IMPLANT
CATH ROBINSON RED A/P 20FR (CATHETERS) IMPLANT
CATH ULTRATHANE 14FR (CATHETERS) IMPLANT
CATH UROLOGY TORQUE 40 (MISCELLANEOUS) IMPLANT
CATH X-FORCE N30 NEPHROSTOMY (TUBING) IMPLANT
CHLORAPREP W/TINT 26 (MISCELLANEOUS) ×6 IMPLANT
DRAPE C-ARM 42X120 X-RAY (DRAPES) ×3 IMPLANT
DRAPE LINGEMAN PERC (DRAPES) ×3 IMPLANT
DRAPE SHEET LG 3/4 BI-LAMINATE (DRAPES) IMPLANT
DRAPE SURG IRRIG POUCH 19X23 (DRAPES) ×3 IMPLANT
DRSG PAD ABDOMINAL 8X10 ST (GAUZE/BANDAGES/DRESSINGS) ×6 IMPLANT
DRSG TEGADERM 4X4.75 (GAUZE/BANDAGES/DRESSINGS) ×2 IMPLANT
DRSG TEGADERM 8X12 (GAUZE/BANDAGES/DRESSINGS) ×6 IMPLANT
GAUZE SPONGE 4X4 12PLY STRL (GAUZE/BANDAGES/DRESSINGS) ×3 IMPLANT
GLOVE SURG ENC TEXT LTX SZ7.5 (GLOVE) ×3 IMPLANT
GOWN STRL REUS W/TWL LRG LVL3 (GOWN DISPOSABLE) ×3 IMPLANT
GUIDEWIRE AMPLAZ .035X145 (WIRE) ×6 IMPLANT
GUIDEWIRE ANG ZIPWIRE 035X150 (WIRE) ×2 IMPLANT
GUIDEWIRE ANG ZIPWIRE 038X150 (WIRE) ×3 IMPLANT
GUIDEWIRE STR DUAL SENSOR (WIRE) ×2 IMPLANT
IV SET EXTENSION CATH 6 NF (IV SETS) IMPLANT
KIT BASIN OR (CUSTOM PROCEDURE TRAY) ×3 IMPLANT
KIT PROBE 340X3.4XDISP GRN (MISCELLANEOUS) IMPLANT
KIT PROBE TRILOGY 3.4X340 (MISCELLANEOUS)
KIT PROBE TRILOGY 3.9X350 (MISCELLANEOUS) IMPLANT
KIT TURNOVER KIT A (KITS) ×3 IMPLANT
LASER FIB FLEXIVA PULSE ID 365 (Laser) IMPLANT
MANIFOLD NEPTUNE II (INSTRUMENTS) ×3 IMPLANT
NDL TROCAR 18X15 ECHO (NEEDLE) IMPLANT
NDL TROCAR 18X20 (NEEDLE) IMPLANT
NEEDLE TROCAR 18X15 ECHO (NEEDLE) IMPLANT
NEEDLE TROCAR 18X20 (NEEDLE) IMPLANT
NS IRRIG 1000ML POUR BTL (IV SOLUTION) ×3 IMPLANT
PACK CYSTO (CUSTOM PROCEDURE TRAY) IMPLANT
SHEATH PEELAWAY SET 9 (SHEATH) IMPLANT
SPONGE T-LAP 4X18 ~~LOC~~+RFID (SPONGE) ×3 IMPLANT
STENT URET 6FRX24 CONTOUR (STENTS) ×2 IMPLANT
SURGIFLO W/THROMBIN 8M KIT (HEMOSTASIS) IMPLANT
SUT SILK 2 0 30  PSL (SUTURE) ×3
SUT SILK 2 0 30 PSL (SUTURE) ×1 IMPLANT
SUT VIC AB 2-0 CT1 27 (SUTURE) ×3
SUT VIC AB 2-0 CT1 TAPERPNT 27 (SUTURE) IMPLANT
SYR 10ML LL (SYRINGE) ×3 IMPLANT
SYR 20ML LL LF (SYRINGE) ×6 IMPLANT
SYR 50ML LL SCALE MARK (SYRINGE) ×3 IMPLANT
TOWEL OR 17X26 10 PK STRL BLUE (TOWEL DISPOSABLE) ×3 IMPLANT
TRACTIP FLEXIVA PULS ID 200XHI (Laser) IMPLANT
TRACTIP FLEXIVA PULSE ID 200 (Laser)
TRAY FOLEY MTR SLVR 16FR STAT (SET/KITS/TRAYS/PACK) ×3 IMPLANT
TUBE CONNECTING VINYL 14FR 30C (TUBING) IMPLANT
TUBING CONNECTING 10 (TUBING) ×4 IMPLANT
TUBING CONNECTING 10' (TUBING) ×2
TUBING STONE CATCHER TRILOGY (MISCELLANEOUS) IMPLANT
TUBING UROLOGY SET (TUBING) IMPLANT
WATER STERILE IRR 1000ML POUR (IV SOLUTION) ×3 IMPLANT
WATER STERILE IRR 3000ML UROMA (IV SOLUTION) ×3 IMPLANT

## 2021-03-22 NOTE — Anesthesia Postprocedure Evaluation (Signed)
Anesthesia Post Note  Patient: Linda Stein  Procedure(s) Performed: NEPHROLITHOTOMY PERCUTANEOUS SECOND LOOK, URETERAL STENT PLACEMENT,LEFT (Right: Back)     Patient location during evaluation: PACU Anesthesia Type: General Level of consciousness: awake and alert Pain management: pain level controlled Vital Signs Assessment: post-procedure vital signs reviewed and stable Respiratory status: spontaneous breathing, nonlabored ventilation, respiratory function stable and patient connected to nasal cannula oxygen Cardiovascular status: blood pressure returned to baseline and stable Postop Assessment: no apparent nausea or vomiting Anesthetic complications: no   No notable events documented.  Last Vitals:  Vitals:   03/22/21 1815 03/22/21 1833  BP: (!) 135/58 (!) 140/58  Pulse: 93 86  Resp: 16   Temp: 37.7 C 36.9 C  SpO2: 99% 93%    Last Pain:  Vitals:   03/22/21 1913  TempSrc:   PainSc: [place]

## 2021-03-22 NOTE — Brief Op Note (Signed)
03/20/2021 - 03/22/2021  5:09 PM  PATIENT:  Linda Stein  87 y.o. female  PRE-OPERATIVE DIAGNOSIS:  RIGHT PARTIAL STAGHORN STONE  POST-OPERATIVE DIAGNOSIS:  RIGHT PARTIAL STAGHORN STONE  PROCEDURE:  2nd Look RIGHT Percutnaeous nephrostolithotomy, RT ureterl stent exchange, RT diagnostic ureteroscopy, RT nephrostogram  SURGEON:  Surgeon(s) and Role:    * Alexis Frock, MD - Primary  PHYSICIAN ASSISTANT:   ASSISTANTS: none   ANESTHESIA:   general  EBL:  minimal   BLOOD ADMINISTERED:none  DRAINS:  foley to gravity    LOCAL MEDICATIONS USED:  NONE  SPECIMEN:  No Specimen  DISPOSITION OF SPECIMEN:  N/A  COUNTS:  YES  TOURNIQUET:  * No tourniquets in log *  DICTATION: .Other Dictation: Dictation Number [id number]  PLAN OF CARE: Admit to inpatient   PATIENT DISPOSITION:  PACU - hemodynamically stable.   Delay start of Pharmacological VTE agent (>24hrs) due to surgical blood loss or risk of bleeding: not applicable

## 2021-03-22 NOTE — Anesthesia Procedure Notes (Signed)
Procedure Name: Intubation Date/Time: 03/22/2021 4:21 PM Performed by: Montel Clock, CRNA Pre-anesthesia Checklist: Patient identified, Emergency Drugs available, Suction available, Patient being monitored and Timeout performed Patient Re-evaluated:Patient Re-evaluated prior to induction Oxygen Delivery Method: Circle system utilized Preoxygenation: Pre-oxygenation with 100% oxygen Induction Type: IV induction Ventilation: Mask ventilation without difficulty Laryngoscope Size: Mac and 3 Grade View: Grade II Tube type: Oral Tube size: 7.0 mm Number of attempts: 1 Airway Equipment and Method: Stylet Placement Confirmation: ETT inserted through vocal cords under direct vision, positive ETCO2 and breath sounds checked- equal and bilateral Secured at: 21 cm Tube secured with: Tape Dental Injury: Teeth and Oropharynx as per pre-operative assessment

## 2021-03-22 NOTE — Anesthesia Preprocedure Evaluation (Addendum)
Anesthesia Evaluation  Patient identified by MRN, date of birth, ID band Patient awake    Reviewed: Allergy & Precautions, NPO status , Patient's Chart, lab work & pertinent test results  Airway Mallampati: II  TM Distance: >3 FB Neck ROM: Full    Dental  (+) Partial Lower, Partial Upper   Pulmonary former smoker,    breath sounds clear to auscultation       Cardiovascular negative cardio ROS   Rhythm:Regular Rate:Normal     Neuro/Psych negative neurological ROS  negative psych ROS   GI/Hepatic Neg liver ROS, GERD  ,  Endo/Other  negative endocrine ROS  Renal/GU Renal disease     Musculoskeletal negative musculoskeletal ROS (+)   Abdominal Normal abdominal exam  (+)   Peds  Hematology negative hematology ROS (+)   Anesthesia Other Findings   Reproductive/Obstetrics                            Anesthesia Physical Anesthesia Plan  ASA: 3  Anesthesia Plan: General   Post-op Pain Management:    Induction: Intravenous  PONV Risk Score and Plan: 4 or greater and Ondansetron, Dexamethasone and Treatment may vary due to age or medical condition  Airway Management Planned: Oral ETT  Additional Equipment: None  Intra-op Plan:   Post-operative Plan: Extubation in OR  Informed Consent: I have reviewed the patients History and Physical, chart, labs and discussed the procedure including the risks, benefits and alternatives for the proposed anesthesia with the patient or authorized representative who has indicated his/her understanding and acceptance.     Dental advisory given  Plan Discussed with: CRNA  Anesthesia Plan Comments:        Anesthesia Quick Evaluation

## 2021-03-22 NOTE — Transfer of Care (Signed)
Immediate Anesthesia Transfer of Care Note  Patient: Linda Stein  Procedure(s) Performed: NEPHROLITHOTOMY PERCUTANEOUS SECOND LOOK, URETERAL STENT PLACEMENT,LEFT (Right: Back)  Patient Location: PACU  Anesthesia Type:General  Level of Consciousness: drowsy and patient cooperative  Airway & Oxygen Therapy: Patient Spontanous Breathing and Patient connected to face mask oxygen  Post-op Assessment: Report given to RN and Post -op Vital signs reviewed and stable  Post vital signs: Reviewed and stable  Last Vitals:  Vitals Value Taken Time  BP 169/63 03/22/21 1722  Temp    Pulse 106 03/22/21 1723  Resp 16 03/22/21 1723  SpO2 100 % 03/22/21 1723  Vitals shown include unvalidated device data.  Last Pain:  Vitals:   03/22/21 1526  TempSrc: Oral  PainSc: 0-No pain      Patients Stated Pain Goal: 2 ([id number])  Complications: No notable events documented.

## 2021-03-22 NOTE — Progress Notes (Signed)
   Subjective/Chief Complaint:  1 - Large Volume RIGHT Renal Stones - s/p first stage percutaneous nephrostolithotomy 03/20/10 for large volume multifocal renal stone in setting of obstruction and recurrent UTI. Estimated 70% stone volume removed (mostly renal pelvis), but unable to visualize stone in autely angled calyces.   2 - Superficial Forearm Abrasion - superficial (epidermis only) about 2cm diameter dorsal skin abrasion noted during intra-operative positioning (noted after cysto but before transitioning prone) during 1st stage pCNL on 8/10. Cause / injury not directly observed. Dry dressing placed to help protect against further irritation or soiling intra-op. Managing with topicals and PRN dressing changes. She is on chronic steroids which does make skin very fragile.   Today "Linda Stein" is stable and ready for 2nd stage RIGHT sided stone surgery with goal of stone free.    Objective: Vital signs in last 24 hours: Temp:  [98.3 F (36.8 C)] 98.3 F (36.8 C) (08/12 0624) Pulse Rate:  [69-76] 69 (08/12 0624) Resp:  [18-20] 18 (08/12 0624) BP: (142-151)/(62-63) 151/62 (08/12 0624) SpO2:  [98 %-99 %] 99 % (08/12 0624) Last BM Date: 03/19/21  Intake/Output from previous day: 08/11 0701 - 08/12 0700 In: -  Out: 1475 [id number] Intake/Output this shift: No intake/output data recorded.  EXAM: NAD, AO x3 Non-labored breathing on room air Sinus 70s Mer[place] place Rt nepht ueb with medium yellow urine Folye with medium yellow urine Rt forarm with dry dresssing, no swelling / hematomas.   Lab Results:  Recent Labs    03/20/21 1135 03/21/21 0435  HGB 11.3* 10.4*  HCT 36.7 32.5*   BMET Recent Labs    03/21/21 0435  NA 136  K 4.5  CL 105  CO2 25  GLUCOSE 124*  BUN 18  CREATININE 0.67  CALCIUM 8.9   PT/INR No results for input(s): LABPROT, INR in the last 72 hours. ABG No results for input(s): PHART, HCO3 in the last 72 hours.  Invalid input(s): PCO2,  PO2  Studies/Results: DG C-Arm 1-60 Min-No Report  Result Date: 03/20/2021 Fluoroscopy was utilized by the requesting physician.  No radiographic interpretation.   DG C-Arm 1-60 Min-No Report  Result Date: 03/20/2021 Fluoroscopy was utilized by the requesting physician.  No radiographic interpretation.    Anti-infectives: Anti-infectives (From admission, onward)    Start     Dose/Rate Route Frequency Ordered Stop   03/22/21 1500  piperacillin-tazobactam (ZOSYN) IVPB 3.375 g        3.375 g 100 mL/hr over 30 Minutes Intravenous  Once 03/21/21 0800     03/20/21 0645  piperacillin-tazobactam (ZOSYN) IVPB 3.375 g        3.375 g 100 mL/hr over 30 Minutes Intravenous 30 min pre-op 03/20/21 [id number] 03/20/21 [id number]       Assessment/Plan:   1 - Large Volume RIGHT Renal Stones - Proceed as planned with 2nd stage RIGHT PCNL with goal of stone free. Likely residual stone in acutely anglec calyces which will optimally be amenable to flexible endoscopic retrieval. Risks, benefits, alternatives, expected peri-op course discussed previously and reiterated today.   2 - Superficial Forearm Abrasion - minor and non-complex. No superinfection. Only rec PRN dressing changes to prevent further irritation.   Alexis Frock 03/22/2021

## 2021-03-23 ENCOUNTER — Encounter (HOSPITAL_COMMUNITY): Payer: Self-pay | Admitting: Urology

## 2021-03-23 LAB — BASIC METABOLIC PANEL
Anion gap: 7 (ref 5–15)
BUN: 14 mg/dL (ref 8–23)
CO2: 25 mmol/L (ref 22–32)
Calcium: 8.6 mg/dL — ABNORMAL LOW (ref 8.9–10.3)
Chloride: 105 mmol/L (ref 98–111)
Creatinine, Ser: 0.59 mg/dL (ref 0.44–1.00)
GFR, Estimated: >60 mL/min (ref >60)
Glucose, Bld: 106 mg/dL — ABNORMAL HIGH (ref 70–99)
Potassium: 4.2 mmol/L (ref 3.5–5.1)
Sodium: 137 mmol/L (ref 135–145)

## 2021-03-23 LAB — HEMOGLOBIN AND HEMATOCRIT, BLOOD
HCT: 33.4 % — ABNORMAL LOW (ref 36.0–46.0)
Hemoglobin: 10.5 g/dL — ABNORMAL LOW (ref 12.0–15.0)

## 2021-03-23 NOTE — Op Note (Signed)
Linda Stein, Linda Stein MEDICAL RECORD NO: [id number] ACCOUNT NO: [id number] DATE OF BIRTH: [birth year] FACILITY: [facility] LOCATION: [facility] PHYSICIAN: Alexis Frock, MD  Operative Report   PREOPERATIVE DIAGNOSIS:  Residual left renal stone, status post first stage procedure.  POSTOPERATIVE DIAGNOSIS:  Residual left renal stone, status post first stage procedure.  PROCEDURE PERFORMED: 1.  Right second stage percutaneous nephrostolithotomy stone less than 2 cm. 2.  Right antegrade nephrostogram interpretation. 3.  Exchange of right ureteral stent, 6 x 24 Contour, no tether. 4.  Right diagnostic ureteroscopy.  ESTIMATED BLOOD LOSS:  Nil.  COMPLICATIONS:  None.  SPECIMENS:  None.  FINDINGS: 1.  Minimal volume residual right renal stone amenable to irrigation only. 2.  Complete resolution of all accessible stone fragments larger than one-third mm following second stage percutaneous nephrostolithotomy in the right kidney and the right ureter. 3.  Successful placement of right ureteral stent, proximal end in renal pelvis, distal end in urinary bladder.  INDICATIONS FOR PROCEDURE:  The patient is a very pleasant and quite vigorous 87 year old lady who was found on workup of recurrent urinary tract infections to have a very large right UPJ stone as well as some additional lower pole stone on the right  side. Given her good functional status, I clearly felt that intervention with a goal of stone free would be warranted.  She underwent first stage percutaneous nephrostolithotomy on 03/20/2021 at which point, I estimated we had removed approximately 80%  of the stone volume concentrating on the renal pelvis stone.  However, due to access sheath usage, I was not able to obtain satisfactory visualization of acutely angled lower pole calices where some stone material was noted on preoperative imaging.  As  such, we discussed options including a second stage procedure with goal of inspecting these areas  and verifying stone free and rendering her stone free and the patient wished to proceed.  Informed consent obtained and placed in the medical record.  PROCEDURE IN DETAIL:  The patient being herself verified and procedure being right second stage percutaneous nephrostolithotomy was confirmed.  Procedure timeout was performed.  Intravenous antibiotics administered.  General endotracheal anesthesia was  induced.  The patient was placed into a prone position, employing prone view, chest rolls, padding of her knees and ankles, axillary rolls.  In situ Foley catheter was kept to straight drain.  The patient's right flank and in situ nephrostomy tube and  nephroureteral stent were prepped into an operative field in the right flank and percutaneous drape was applied.  Attention was directed at antegrade nephrostogram.  Initial antegrade nephrostogram revealed excellent placement of the right nephrostomy tube into the upper pole calix.  No obvious filling defects or extravasation.  Nephrostomy tube was then removed and set aside for discard, inspected and intact and  flexible nephroscopy was performed using a 16-French flexible cystoscope.  Inspection of all accessible calices revealed minimal residual stone material and just a few very small fragments, which irrigated freely out with the irrigation alone.  The upper  pole was inspected, lower pole and very careful consideration to inspection of all accessible mid and lower pole calices, some of which were acutely angled I was pleasantly surprised that there was minimal residual stone material within these calices.   This was quite advantageous as a goal today was to verify completely stone free status on the right side. The nephroureteral stent was exchanged for a ZIPwire acting as a safety wire.  A second sensor wire was advanced in antegrade fashion  to the level  of the urinary bladder and the flexible cystoscope was exchanged over the sensor working wire for a  single channel digital ureteroscope, which was placed under fluoroscopic guidance all the way to the level of the urinary bladder and the entire right  ureter was inspected using flexible ureteroscopy.  This revealed no evidence of intraluminal stone whatsoever.  No evidence of any significant urothelial lesions.  Having cleared the right ureter and right kidney of all accessible stone fragments, we  achieved the goals of the second stage procedure today.  It was felt that a tubeless technique with placement of an antegrade stent would be most prudent and as such a new 6 x 24 Contour type stent was placed over the remaining safety using a  nephroscopic and fluoroscopic guidance.  Good proximal and distal planes were noted.  A nephroscope was removed.  The flank was closed using interrupted Vicryl x4 followed by a dressing of 4 x 4, and Tegaderm.  Procedure was terminated.  The patient  tolerated the procedure well.  No immediate perioperative complications.  The patient was taken to postanesthesia care unit in stable condition.  She will return to the hospital floor tonight.  Continue Foley catheter overnight and plan for possible  discharge as soon as tomorrow pending functional status.   NIK D: 03/22/2021 5:16:31 pm T: 03/23/2021 11:23:00 am  JOB: [id number]

## 2021-03-23 NOTE — Plan of Care (Signed)

## 2021-03-23 NOTE — Discharge Summary (Addendum)
[facility] Discharge Summary  Admit date: 03/20/2021  Discharge date and time: 03/23/21   Discharge to: Home  Discharge Service: Urology  Discharge Attending Physician:  Link Snuffer, MD  Discharge  Diagnoses: Staghorn calculus  Secondary Diagnosis: Active Problems:   Staghorn calculus   OR Procedures: Procedure(s): NEPHROLITHOTOMY PERCUTANEOUS SECOND LOOK, URETERAL STENT PLACEMENT,LEFT 03/22/2021   Ancillary Procedures: None   Discharge Day Services: The patient was seen and examined by the Urology team both in the morning and immediately prior to discharge.  Vital signs and laboratory values were stable and within normal limits.  The physical exam was benign and unchanged and all surgical wounds were examined.  Discharge instructions were explained and all questions answered.  Subjective  No acute events overnight. Pain Controlled. No fever or chills.  Objective Patient Vitals for the past 8 hrs:  BP Temp Temp src Pulse Resp SpO2  03/23/21 0429 139/67 98.2 F (36.8 C) Oral 67 18 97 %   No intake/output data recorded.  General Appearance:        No acute distress Lungs:                       Normal work of breathing on room air Heart:                                Regular rate and rhythm Abdomen:                         Soft, non-tender, non-distended.  Right nephrostomy tube incision is clean dry and intact.  Foley catheter is draining clear yellow urine output. Extremities:                      Warm and well perfused   Hospital Course:  The patient underwent first stage percutaneous nephrostolithotomy 03/20/10 for large volume multifocal renal stone in setting of obstruction and recurrent UTI.she then underwent second stage right percutaneous nephrostolithotomy and internalization of nephrostomy tube into right ureteral stent on 03/22/2021.  The patient tolerated the procedure well, was extubated in the OR, and afterwards was taken to the PACU for routine  post-surgical care. When stable the patient was transferred to the floor.   The patient did well postoperatively.  The patient's diet was slowly advanced and at the time of discharge was tolerating a regular diet.  The patient was discharged home 03/23/2021 at which point was tolerating a regular solid diet, was able to void spontaneously, have adequate pain control with P.O. pain medication, and could ambulate without difficulty. The patient will follow up with [place] for post op check.   She was discharged with as needed tramadol and scheduled Augmentin.  She will follow-up on 04/09/2021 with Dr. Tresa Moore for postop check.  Condition at Discharge: Improved  Discharge Medications:  Allergies as of 03/23/2021       Reactions   Sulfa Antibiotics Other (See Comments)   Sores in mouth.        Medication List     TAKE these medications    acetaminophen 500 MG tablet Commonly known as: TYLENOL Take 1,000 mg by mouth every 6 (six) hours as needed for mild pain.   alendronate 70 MG tablet Commonly known as: FOSAMAX Take 70 mg by mouth every Monday.   amoxicillin-clavulanate 500-125 MG tablet Commonly known as: Augmentin Take 1 tablet (500 mg  total) by mouth 2 (two) times daily for 3 days. Begin day before next Urology appointment.   predniSONE 5 MG tablet Commonly known as: DELTASONE Take 5 mg by mouth daily with breakfast.   PRILOSEC PO Take 20 mg by mouth daily.   senna-docusate 8.6-50 MG tablet Commonly known as: Senokot-S Take 1 tablet by mouth 2 (two) times daily. While taking strong pain meds to prevent constipation   traMADol 50 MG tablet Commonly known as: Ultram Take 1-2 tablets (50-100 mg total) by mouth every 6 (six) hours as needed for moderate pain or severe pain (post-operatively).   VITAMIN D3 PO Take 5,000 Units by mouth daily.

## 2021-03-23 NOTE — Progress Notes (Signed)
Patient and her son given discharge, medication, and follow up instructions, verbalized understanding, IV removed, has voided x 1 post FC removal, personal belongings with patient, son to transport home

## 2021-06-24 DIAGNOSIS — M2550 Pain in unspecified joint: Secondary | ICD-10-CM | POA: Diagnosis not present

## 2021-06-24 DIAGNOSIS — M8580 Other specified disorders of bone density and structure, unspecified site: Secondary | ICD-10-CM | POA: Diagnosis not present

## 2021-06-24 DIAGNOSIS — M353 Polymyalgia rheumatica: Secondary | ICD-10-CM | POA: Diagnosis not present

## 2021-06-24 DIAGNOSIS — Z7952 Long term (current) use of systemic steroids: Secondary | ICD-10-CM | POA: Diagnosis not present

## 2021-08-15 DIAGNOSIS — M25473 Effusion, unspecified ankle: Secondary | ICD-10-CM | POA: Diagnosis not present

## 2021-08-15 DIAGNOSIS — T148XXA Other injury of unspecified body region, initial encounter: Secondary | ICD-10-CM | POA: Diagnosis not present

## 2021-08-15 DIAGNOSIS — Z6821 Body mass index (BMI) 21.0-21.9, adult: Secondary | ICD-10-CM | POA: Diagnosis not present

## 2021-08-30 DIAGNOSIS — K219 Gastro-esophageal reflux disease without esophagitis: Secondary | ICD-10-CM | POA: Diagnosis not present

## 2021-08-30 DIAGNOSIS — Z6821 Body mass index (BMI) 21.0-21.9, adult: Secondary | ICD-10-CM | POA: Diagnosis not present

## 2021-08-30 DIAGNOSIS — R079 Chest pain, unspecified: Secondary | ICD-10-CM | POA: Diagnosis not present

## 2021-09-06 DIAGNOSIS — Z6821 Body mass index (BMI) 21.0-21.9, adult: Secondary | ICD-10-CM | POA: Diagnosis not present

## 2021-09-06 DIAGNOSIS — R079 Chest pain, unspecified: Secondary | ICD-10-CM | POA: Diagnosis not present

## 2021-09-06 DIAGNOSIS — K219 Gastro-esophageal reflux disease without esophagitis: Secondary | ICD-10-CM | POA: Diagnosis not present

## 2021-09-10 ENCOUNTER — Encounter: Payer: Self-pay | Admitting: Internal Medicine

## 2021-09-13 ENCOUNTER — Encounter: Payer: Self-pay | Admitting: Cardiology

## 2021-09-13 ENCOUNTER — Telehealth: Payer: Self-pay | Admitting: Cardiology

## 2021-09-13 ENCOUNTER — Ambulatory Visit: Payer: Medicare Other | Admitting: Cardiology

## 2021-09-13 ENCOUNTER — Other Ambulatory Visit: Payer: Self-pay

## 2021-09-13 VITALS — BP 134/70 | HR 70 | Ht 62.0 in | Wt 119.0 lb

## 2021-09-13 DIAGNOSIS — K219 Gastro-esophageal reflux disease without esophagitis: Secondary | ICD-10-CM

## 2021-09-13 DIAGNOSIS — R079 Chest pain, unspecified: Secondary | ICD-10-CM | POA: Diagnosis not present

## 2021-09-13 NOTE — Progress Notes (Signed)
Cardiology Office Note  Date: 09/13/2021   ID: Linda Stein, DOB 1934/03/29, MRN [id number]  PCP:  Trail Nation, MD  Cardiologist:  Rozann Lesches, MD Electrophysiologist:  None   Chief Complaint  Patient presents with   Chest discomfort    History of Present Illness: Linda Stein is an 88 y.o. female referred for cardiology consultation by Dr. Jimmye Norman for evaluation of chest discomfort.  She reports history of reflux, previously taking an over-the-counter PPI and just recently changed to Protonix by her PCP due to worsening symptoms.  She describes a tightness in her chest, intermittent belching, no obvious dysphagia.  In terms of exertion, she does not relate any chest tightness to her typical activities including walking up 14 steps to her second floor apartment.  She has no known history of ischemic heart disease, has not undergone any prior ischemic testing.  I personally reviewed her ECG today which shows sinus rhythm with incomplete right bundle branch block.  Past Medical History:  Diagnosis Date   Atrophic vaginitis    GERD (gastroesophageal reflux disease)    History of kidney stones    Irritable bowel syndrome     Past Surgical History:  Procedure Laterality Date   Cataract surgery  2021   CESAREAN SECTION  1964   NEPHROLITHOTOMY Right 03/20/2021   Procedure: FIRST STAGE RIGHT NEPHROLITHOTOMY PERCUTANEOUS WITH  SURGEON ACCESS CYSTOSCOPY RIGHT RETROGRADE;  Surgeon: Alexis Frock, MD;  Location: [facility];  Service: Urology;  Laterality: Right;   NEPHROLITHOTOMY Right 03/22/2021   Procedure: NEPHROLITHOTOMY PERCUTANEOUS SECOND LOOK, URETERAL STENT PLACEMENT,LEFT;  Surgeon: Alexis Frock, MD;  Location: [facility];  Service: Urology;  Laterality: Right;    Current Outpatient Medications  Medication Sig Dispense Refill   acetaminophen (TYLENOL) 500 MG tablet Take 1,000 mg by mouth every 6 (six) hours as needed for mild pain.     aspirin EC 81 MG tablet  Take 81 mg by mouth daily. Swallow whole.     Cholecalciferol (VITAMIN D3 PO) Take 5,000 Units by mouth daily.     predniSONE (DELTASONE) 5 MG tablet Take 4 mg by mouth daily with breakfast.     pantoprazole (PROTONIX) 40 MG tablet Take 40 mg by mouth 2 (two) times daily.     No current facility-administered medications for this visit.   Allergies:  Sulfa antibiotics   Social History: The patient  reports that she has quit smoking. Her smoking use included cigarettes. She has never used smokeless tobacco. She reports that she does not drink alcohol and does not use drugs.   Family History: The patient's family history includes Cancer in her father; Other in her mother and son; Stroke in her mother.   ROS: No palpitations or syncope.  No orthopnea or PND.  Physical Exam: VS:  BP 134/70    Pulse 70    Ht 5\' 2"  (1.575 m)    Wt 119 lb (54 kg)    SpO2 100%    BMI 21.77 kg/m , BMI Body mass index is 21.77 kg/m.  Wt Readings from Last 3 Encounters:  09/13/21 119 lb (54 kg)  03/20/21 120 lb 2.4 oz (54.5 kg)  03/18/21 120 lb 3.2 oz (54.5 kg)    General: Patient appears comfortable at rest. HEENT: Conjunctiva and lids normal, wearing a mask. Neck: Supple, no elevated JVP or carotid bruits, no thyromegaly. Lungs: Clear to auscultation, nonlabored breathing at rest. Cardiac: Regular rate and rhythm, no S3 or significant systolic murmur, no  pericardial rub. Abdomen: Soft, nontender, bowel sounds present. Extremities: No pitting edema, distal pulses 2+. Skin: Warm and dry. Musculoskeletal: No kyphosis. Neuropsychiatric: Alert and oriented x3, affect grossly appropriate.  ECG:  An ECG dated 09/06/2021 was personally reviewed today and demonstrated:  Sinus rhythm with incomplete right bundle branch block and left atrial enlargement.  Recent Labwork: 03/18/2021: Platelets 268 03/23/2021: BUN 14; Creatinine, Ser 0.59; Hemoglobin 10.5; Potassium 4.2; Sodium 137  January 2023: Hemoglobin 12.3,  platelets 203, BUN 18, creatinine 0.77, potassium 4.2, AST 20, ALT 11  Other Studies Reviewed Today:  No prior cardiac testing for review today.  Assessment and Plan:  1.  Recurrent chest tightness as well as intermittent belching and known history of GERD.  Symptoms have not responded to increasing doses of over-the-counter PPI, just recently started on Protonix per PCP.  ECG shows incomplete right bundle branch block, no definite ischemic changes.  She has otherwise been relatively healthy, has not undergone any prior ischemic testing.  Plan will be to proceed with an exercise Myoview for screening evaluation.  2.  GERD, now on Protonix.  If ischemic work-up is reassuring, may need to consider GI consultation for endoscopy if symptoms do not improve.  Medication Adjustments/Labs and Tests Ordered: Current medicines are reviewed at length with the patient today.  Concerns regarding medicines are outlined above.   Tests Ordered: Orders Placed This Encounter  Procedures   NM Myocar Multi W/Spect W/Wall Motion / EF   EKG 12-Lead    Medication Changes: No orders of the defined types were placed in this encounter.   Disposition:  Follow up  test results.  Signed, Satira Sark, MD, [facility] 09/13/2021 11:03 AM    [facility] [place] [address], [place], [place] [address] Phone: [phone]; Fax: [phone]

## 2021-09-13 NOTE — Telephone Encounter (Signed)
Checking percert on the following patient for testing scheduled at [facility].    EXERCISE MYOVIEW  09/19/2021

## 2021-09-13 NOTE — Patient Instructions (Signed)
Testing/Procedures: Exercise Myoview for current chest pain  Follow-Up: Follow up pending test results

## 2021-09-17 DIAGNOSIS — M810 Age-related osteoporosis without current pathological fracture: Secondary | ICD-10-CM | POA: Diagnosis not present

## 2021-09-19 ENCOUNTER — Encounter (HOSPITAL_COMMUNITY): Payer: Self-pay

## 2021-09-19 ENCOUNTER — Encounter (HOSPITAL_COMMUNITY)
Admission: RE | Admit: 2021-09-19 | Discharge: 2021-09-19 | Disposition: A | Discharge status: Home / Self Care | Payer: Medicare Other | Source: Ambulatory Visit | Attending: Cardiology | Admitting: Cardiology

## 2021-09-19 ENCOUNTER — Other Ambulatory Visit: Payer: Self-pay

## 2021-09-19 ENCOUNTER — Ambulatory Visit (HOSPITAL_COMMUNITY)
Admission: RE | Admit: 2021-09-19 | Discharge: 2021-09-19 | Disposition: A | Discharge status: Home / Self Care | Payer: Medicare Other | Source: Ambulatory Visit | Attending: Cardiology | Admitting: Cardiology

## 2021-09-19 DIAGNOSIS — R079 Chest pain, unspecified: Secondary | ICD-10-CM | POA: Insufficient documentation

## 2021-09-19 LAB — NM MYOCAR MULTI W/SPECT W/WALL MOTION / EF
Angina Index: 0
Duke Treadmill Score: 2
Estimated workload: 4.6
Exercise duration (min): 2 min
Exercise duration (sec): 16 s
LV dias vol: 54 mL (ref 46–106)
LV sys vol: 17 mL
Nuc Stress EF: 69 %
Peak HR: 135 {beats}/min
Percent HR: 104 %
RATE: 0.4
Rest HR: 81 {beats}/min
Rest Nuclear Isotope Dose: 11 mCi
SDS: 2
SRS: 1
SSS: 3
ST Depression (mm): 0 mm
Stress Nuclear Isotope Dose: 31 mCi
TID: 1.04

## 2021-09-19 MED ORDER — SODIUM CHLORIDE FLUSH 0.9 % IV SOLN
INTRAVENOUS | Status: AC
  Administered 2021-09-19: 10 mL via INTRAVENOUS
  Filled 2021-09-19: qty 10

## 2021-09-19 MED ORDER — REGADENOSON 0.4 MG/5ML IV SOLN
INTRAVENOUS | Status: AC
  Filled 2021-09-19: qty 5

## 2021-09-19 MED ORDER — TECHNETIUM TC 99M TETROFOSMIN IV KIT
30.0000 | PACK | Freq: Once | INTRAVENOUS | Status: AC | PRN
  Administered 2021-09-19: 31 via INTRAVENOUS

## 2021-09-19 MED ORDER — TECHNETIUM TC 99M TETROFOSMIN IV KIT
10.0000 | PACK | Freq: Once | INTRAVENOUS | Status: AC | PRN
  Administered 2021-09-19: 11 via INTRAVENOUS

## 2021-09-25 ENCOUNTER — Other Ambulatory Visit: Payer: Self-pay

## 2021-09-25 ENCOUNTER — Encounter: Payer: Self-pay | Admitting: Internal Medicine

## 2021-09-25 ENCOUNTER — Ambulatory Visit: Payer: Medicare Other | Admitting: Internal Medicine

## 2021-09-25 VITALS — BP 125/67 | HR 78 | Temp 97.5°F | Ht 60.0 in | Wt 119.6 lb

## 2021-09-25 DIAGNOSIS — R1319 Other dysphagia: Secondary | ICD-10-CM

## 2021-09-25 DIAGNOSIS — K219 Gastro-esophageal reflux disease without esophagitis: Secondary | ICD-10-CM

## 2021-09-25 NOTE — Patient Instructions (Signed)
We will schedule you for upper endoscopy with possible esophageal dilation to further evaluate your breakthrough reflux as well as difficulty swallowing.  Continue on pantoprazole twice daily for now.  Okay to take Mylanta at night as well.  Further recommendations to follow.  It was very nice meeting you today.  Go [place]!  Dr. Abbey Chatters  Lifestyle and home remedies TO MANAGE REFLUX/HEARTBURN    You may eliminate or reduce the frequency of heartburn by making the following lifestyle changes:   Control your weight. Being overweight is a major risk factor for heartburn and GERD. Excess pounds put pressure on your abdomen, pushing up your stomach and causing acid to back up into your esophagus.    Eat smaller meals. 4 TO 6 MEALS A DAY. This reduces pressure on the lower esophageal sphincter, helping to prevent the valve from opening and acid from washing back into your esophagus.     Loosen your belt. Clothes that fit tightly around your waist put pressure on your abdomen and the lower esophageal sphincter.     Eliminate heartburn triggers. Everyone has specific triggers. Common triggers such as fatty or fried foods, spicy food, tomato sauce, carbonated beverages, alcohol, chocolate, mint, garlic, onion, caffeine and nicotine may make heartburn worse.    Avoid stooping or bending. Tying your shoes is OK. Bending over for longer periods to weed your garden isn't, especially soon after eating.    Don't lie down after a meal. Wait at least three to four hours after eating before going to bed, and don't lie down right after eating.    At [facility] we value your feedback. You may receive a survey about your visit today. Please share your experience as we strive to create trusting relationships with our patients to provide genuine, compassionate, quality care.  We appreciate your understanding and patience as we review any laboratory studies, imaging, and other diagnostic tests  that are ordered as we care for you. Our office policy is 5 business days for review of these results, and any emergent or urgent results are addressed in a timely manner for your best interest. If you do not hear from our office in 1 week, please contact us.   We also encourage the use of MyChart, which contains your medical information for your review as well. If you are not enrolled in this feature, an access code is on this after visit summary for your convenience. Thank you for allowing [place] to be involved in your care.  It was great to see you today!  I hope you have a great rest of your Winter!    Elon Alas. Abbey Chatters, D.O. Gastroenterology and Hepatology [facility]

## 2021-09-25 NOTE — Progress Notes (Signed)
Primary Care Physician:  Osmond Nation, MD Primary Gastroenterologist:  Dr. Abbey Chatters  Chief Complaint  Patient presents with   Gastroesophageal Reflux    C/o burping, heaviness in chest    HPI:   Linda Stein is a 88 y.o. female who presents to clinic today by referral from her PCP Dr. Jimmye Norman for evaluation.  States she has had reflux for many years.  Previously on omeprazole with breakthrough symptoms.  Recently switched to pantoprazole 40 mg twice daily which she states helps some.  Continues to have breakthrough acid reflux and heartburn.  Also notes new onset of dysphagia as well.  States this primarily occurs with solids.  Feels as though sometimes will get stuck in her substernal region.  No melena hematochezia.  No epigastric or chest pain.  No abdominal pain.  She is in good health for her age.  Recently underwent stress testing 09/19/2021 which showed normal LVEF, no evidence of reversible ischemia.  Past Medical History:  Diagnosis Date   Atrophic vaginitis    GERD (gastroesophageal reflux disease)    History of kidney stones    Irritable bowel syndrome     Past Surgical History:  Procedure Laterality Date   Cataract surgery  2021   CESAREAN SECTION  1964   NEPHROLITHOTOMY Right 03/20/2021   Procedure: FIRST STAGE RIGHT NEPHROLITHOTOMY PERCUTANEOUS WITH  SURGEON ACCESS CYSTOSCOPY RIGHT RETROGRADE;  Surgeon: Alexis Frock, MD;  Location: [facility];  Service: Urology;  Laterality: Right;   NEPHROLITHOTOMY Right 03/22/2021   Procedure: NEPHROLITHOTOMY PERCUTANEOUS SECOND LOOK, URETERAL STENT PLACEMENT,LEFT;  Surgeon: Alexis Frock, MD;  Location: [facility];  Service: Urology;  Laterality: Right;    Current Outpatient Medications  Medication Sig Dispense Refill   acetaminophen (TYLENOL) 500 MG tablet Take 1,000 mg by mouth every 6 (six) hours as needed for mild pain.     aspirin EC 81 MG tablet Take 81 mg by mouth daily. Swallow whole.     Cholecalciferol (VITAMIN  D3 PO) Take 5,000 Units by mouth daily.     pantoprazole (PROTONIX) 40 MG tablet Take 40 mg by mouth 2 (two) times daily.     predniSONE (DELTASONE) 5 MG tablet Take 4 mg by mouth daily with breakfast.     No current facility-administered medications for this visit.    Allergies as of 09/25/2021 - Review Complete 09/25/2021  Allergen Reaction Noted   Sulfa antibiotics Other (See Comments) 06/07/2014    Family History  Problem Relation Age of Onset   Other Mother        aneursym   Stroke Mother    Cancer Father    Other Son        hip replacement    Social History   Socioeconomic History   Marital status: Widowed    Spouse name: Not on file   Number of children: Not on file   Years of education: Not on file   Highest education level: Not on file  Occupational History   Not on file  Tobacco Use   Smoking status: Former    Types: Cigarettes   Smokeless tobacco: Never  Vaping Use   Vaping Use: Never used  Substance and Sexual Activity   Alcohol use: No   Drug use: No   Sexual activity: Not Currently    Birth control/protection: Post-menopausal  Other Topics Concern   Not on file  Social History Narrative   Not on file   Social Determinants of Health   Financial  Resource Strain: Not on file  Food Insecurity: Not on file  Transportation Needs: Not on file  Physical Activity: Not on file  Stress: Not on file  Social Connections: Not on file  Intimate Partner Violence: Not on file    Subjective: Review of Systems  Constitutional:  Negative for chills and fever.  HENT:  Negative for congestion and hearing loss.   Eyes:  Negative for blurred vision and double vision.  Respiratory:  Negative for cough and shortness of breath.   Cardiovascular:  Negative for chest pain and palpitations.  Gastrointestinal:  Positive for heartburn. Negative for abdominal pain, blood in stool, constipation, diarrhea, melena and vomiting.       Dysphagia  Genitourinary:  Negative  for dysuria and urgency.  Musculoskeletal:  Negative for joint pain and myalgias.  Skin:  Negative for itching and rash.  Neurological:  Negative for dizziness and headaches.  Psychiatric/Behavioral:  Negative for depression. The patient is not nervous/anxious.       Objective: BP 125/67    Pulse 78    Temp (!) 97.5 F (36.4 C)    Ht 5' (1.524 m)    Wt 119 lb 9.6 oz (54.3 kg)    BMI 23.36 kg/m  Physical Exam Constitutional:      Appearance: Normal appearance.  HENT:     Head: Normocephalic and atraumatic.  Eyes:     Extraocular Movements: Extraocular movements intact.     Conjunctiva/sclera: Conjunctivae normal.  Cardiovascular:     Rate and Rhythm: Normal rate and regular rhythm.  Pulmonary:     Effort: Pulmonary effort is normal.     Breath sounds: Normal breath sounds.  Abdominal:     General: Bowel sounds are normal.     Palpations: Abdomen is soft.  Musculoskeletal:        General: No swelling. Normal range of motion.     Cervical back: Normal range of motion and neck supple.  Skin:    General: Skin is warm and dry.     Coloration: Skin is not jaundiced.  Neurological:     General: No focal deficit present.     Mental Status: She is alert and oriented to person, place, and time.  Psychiatric:        Mood and Affect: Mood normal.        Behavior: Behavior normal.     Assessment: *GERD-not well controlled *Dysphagia  Plan: Will schedule for EGD with possible dilation to evaluate for peptic ulcer disease, esophagitis, gastritis, H. Pylori, duodenitis, or other. Will also evaluate for esophageal stricture, Schatzki's ring, esophageal web or other.   The risks including infection, bleed, or perforation as well as benefits, limitations, alternatives and imponderables have been reviewed with the patient. Potential for esophageal dilation, biopsy, etc. have also been reviewed.  Questions have been answered. All parties agreeable.  We will print off home practices to  decrease reflux symptoms for patient today.  Continue on pantoprazole twice daily for now.  Okay to continue Mylanta at night as needed.  Thank you Dr. Jimmye Norman for the kind referral. 09/25/2021 5:24 PM   Disclaimer: This note was dictated with voice recognition software. Similar sounding words can inadvertently be transcribed and may not be corrected upon review.

## 2021-09-25 NOTE — H&P (View-Only) (Signed)
Primary Care Physician:  Genesee Nation, MD Primary Gastroenterologist:  Dr. Abbey Chatters  Chief Complaint  Patient presents with   Gastroesophageal Reflux    C/o burping, heaviness in chest    HPI:   Linda Stein is a 88 y.o. female who presents to clinic today by referral from her PCP Dr. Jimmye Norman for evaluation.  States she has had reflux for many years.  Previously on omeprazole with breakthrough symptoms.  Recently switched to pantoprazole 40 mg twice daily which she states helps some.  Continues to have breakthrough acid reflux and heartburn.  Also notes new onset of dysphagia as well.  States this primarily occurs with solids.  Feels as though sometimes will get stuck in her substernal region.  No melena hematochezia.  No epigastric or chest pain.  No abdominal pain.  She is in good health for her age.  Recently underwent stress testing 09/19/2021 which showed normal LVEF, no evidence of reversible ischemia.  Past Medical History:  Diagnosis Date   Atrophic vaginitis    GERD (gastroesophageal reflux disease)    History of kidney stones    Irritable bowel syndrome     Past Surgical History:  Procedure Laterality Date   Cataract surgery  2021   CESAREAN SECTION  1964   NEPHROLITHOTOMY Right 03/20/2021   Procedure: FIRST STAGE RIGHT NEPHROLITHOTOMY PERCUTANEOUS WITH  SURGEON ACCESS CYSTOSCOPY RIGHT RETROGRADE;  Surgeon: Alexis Frock, MD;  Location: [facility];  Service: Urology;  Laterality: Right;   NEPHROLITHOTOMY Right 03/22/2021   Procedure: NEPHROLITHOTOMY PERCUTANEOUS SECOND LOOK, URETERAL STENT PLACEMENT,LEFT;  Surgeon: Alexis Frock, MD;  Location: [facility];  Service: Urology;  Laterality: Right;    Current Outpatient Medications  Medication Sig Dispense Refill   acetaminophen (TYLENOL) 500 MG tablet Take 1,000 mg by mouth every 6 (six) hours as needed for mild pain.     aspirin EC 81 MG tablet Take 81 mg by mouth daily. Swallow whole.     Cholecalciferol (VITAMIN  D3 PO) Take 5,000 Units by mouth daily.     pantoprazole (PROTONIX) 40 MG tablet Take 40 mg by mouth 2 (two) times daily.     predniSONE (DELTASONE) 5 MG tablet Take 4 mg by mouth daily with breakfast.     No current facility-administered medications for this visit.    Allergies as of 09/25/2021 - Review Complete 09/25/2021  Allergen Reaction Noted   Sulfa antibiotics Other (See Comments) 06/07/2014    Family History  Problem Relation Age of Onset   Other Mother        aneursym   Stroke Mother    Cancer Father    Other Son        hip replacement    Social History   Socioeconomic History   Marital status: Widowed    Spouse name: Not on file   Number of children: Not on file   Years of education: Not on file   Highest education level: Not on file  Occupational History   Not on file  Tobacco Use   Smoking status: Former    Types: Cigarettes   Smokeless tobacco: Never  Vaping Use   Vaping Use: Never used  Substance and Sexual Activity   Alcohol use: No   Drug use: No   Sexual activity: Not Currently    Birth control/protection: Post-menopausal  Other Topics Concern   Not on file  Social History Narrative   Not on file   Social Determinants of Health   Financial  Resource Strain: Not on file  Food Insecurity: Not on file  Transportation Needs: Not on file  Physical Activity: Not on file  Stress: Not on file  Social Connections: Not on file  Intimate Partner Violence: Not on file    Subjective: Review of Systems  Constitutional:  Negative for chills and fever.  HENT:  Negative for congestion and hearing loss.   Eyes:  Negative for blurred vision and double vision.  Respiratory:  Negative for cough and shortness of breath.   Cardiovascular:  Negative for chest pain and palpitations.  Gastrointestinal:  Positive for heartburn. Negative for abdominal pain, blood in stool, constipation, diarrhea, melena and vomiting.       Dysphagia  Genitourinary:  Negative  for dysuria and urgency.  Musculoskeletal:  Negative for joint pain and myalgias.  Skin:  Negative for itching and rash.  Neurological:  Negative for dizziness and headaches.  Psychiatric/Behavioral:  Negative for depression. The patient is not nervous/anxious.       Objective: BP 125/67    Pulse 78    Temp (!) 97.5 F (36.4 C)    Ht 5' (1.524 m)    Wt 119 lb 9.6 oz (54.3 kg)    BMI 23.36 kg/m  Physical Exam Constitutional:      Appearance: Normal appearance.  HENT:     Head: Normocephalic and atraumatic.  Eyes:     Extraocular Movements: Extraocular movements intact.     Conjunctiva/sclera: Conjunctivae normal.  Cardiovascular:     Rate and Rhythm: Normal rate and regular rhythm.  Pulmonary:     Effort: Pulmonary effort is normal.     Breath sounds: Normal breath sounds.  Abdominal:     General: Bowel sounds are normal.     Palpations: Abdomen is soft.  Musculoskeletal:        General: No swelling. Normal range of motion.     Cervical back: Normal range of motion and neck supple.  Skin:    General: Skin is warm and dry.     Coloration: Skin is not jaundiced.  Neurological:     General: No focal deficit present.     Mental Status: She is alert and oriented to person, place, and time.  Psychiatric:        Mood and Affect: Mood normal.        Behavior: Behavior normal.     Assessment: *GERD-not well controlled *Dysphagia  Plan: Will schedule for EGD with possible dilation to evaluate for peptic ulcer disease, esophagitis, gastritis, H. Pylori, duodenitis, or other. Will also evaluate for esophageal stricture, Schatzki's ring, esophageal web or other.   The risks including infection, bleed, or perforation as well as benefits, limitations, alternatives and imponderables have been reviewed with the patient. Potential for esophageal dilation, biopsy, etc. have also been reviewed.  Questions have been answered. All parties agreeable.  We will print off home practices to  decrease reflux symptoms for patient today.  Continue on pantoprazole twice daily for now.  Okay to continue Mylanta at night as needed.  Thank you Dr. Jimmye Norman for the kind referral. 09/25/2021 5:24 PM   Disclaimer: This note was dictated with voice recognition software. Similar sounding words can inadvertently be transcribed and may not be corrected upon review.

## 2021-09-26 ENCOUNTER — Telehealth: Payer: Self-pay | Admitting: *Deleted

## 2021-09-26 NOTE — Telephone Encounter (Signed)
-----   Message from Satira Sark, MD sent at 09/20/2021  8:22 AM EST ----- Results reviewed.  Please let her know that the stress test was normal, does not suggest obstructive CAD as cause of her symptoms.  No further cardiac work-up planned at this time, would keep follow-up with PCP for further assessment.

## 2021-09-26 NOTE — Telephone Encounter (Signed)
Patient informed. Copy sent to PCP °

## 2021-09-27 DIAGNOSIS — N390 Urinary tract infection, site not specified: Secondary | ICD-10-CM | POA: Diagnosis not present

## 2021-09-27 DIAGNOSIS — M1990 Unspecified osteoarthritis, unspecified site: Secondary | ICD-10-CM | POA: Diagnosis not present

## 2021-09-27 DIAGNOSIS — K219 Gastro-esophageal reflux disease without esophagitis: Secondary | ICD-10-CM | POA: Diagnosis not present

## 2021-09-27 DIAGNOSIS — R079 Chest pain, unspecified: Secondary | ICD-10-CM | POA: Diagnosis not present

## 2021-10-01 DIAGNOSIS — M353 Polymyalgia rheumatica: Secondary | ICD-10-CM | POA: Diagnosis not present

## 2021-10-01 DIAGNOSIS — M150 Primary generalized (osteo)arthritis: Secondary | ICD-10-CM | POA: Diagnosis not present

## 2021-10-01 DIAGNOSIS — M064 Inflammatory polyarthropathy: Secondary | ICD-10-CM | POA: Diagnosis not present

## 2021-10-01 DIAGNOSIS — M2550 Pain in unspecified joint: Secondary | ICD-10-CM | POA: Diagnosis not present

## 2021-10-14 ENCOUNTER — Telehealth: Payer: Self-pay | Admitting: Internal Medicine

## 2021-10-14 NOTE — Telephone Encounter (Signed)
Pt is scheduled with Dr Abbey Chatters on 10/16/2021 for an EGD, but she is wanting to cancel because she has been having diarrhea over the weekend and wants to be seen. We do not have anything anytime soon and she was just seen on 09/25/2021. Please advise and call [phone] ?

## 2021-10-14 NOTE — Telephone Encounter (Signed)
Spoke with pt. She states she started having diarrhea off/on all last week. Dark color, mucus like. Gushes and splatters in the toilet per pt. Reports "something is constantly coming out". She took kaopectate yesterday and hasn't really helped. She wants to cancel EGD for now. Asking for recs for her diarrhea.  ?

## 2021-10-14 NOTE — Telephone Encounter (Signed)
Patient called back stating that she has had one "explosive" episode so far today. Pt states that it was dark black. Pt has only tried kaopectate and that was tried yesterday.  ?

## 2021-10-14 NOTE — Telephone Encounter (Signed)
PATIENT CALLED AGAIN ASKING WHAT CAN BE DONE ABOUT HER DIARRHEA.  SHE CALLED THIS MORNING AND WANTS TO SPEAK T A NURSE.     ?

## 2021-10-14 NOTE — Telephone Encounter (Signed)
See other telephone note.  

## 2021-10-14 NOTE — Telephone Encounter (Signed)
Attempted to call patient twice. She did not answer, will try again tomorrow. Needs cbc, bmp, and GI path panel stool studies if she calls office in meantime. Thanks

## 2021-10-15 ENCOUNTER — Other Ambulatory Visit: Payer: Self-pay

## 2021-10-15 ENCOUNTER — Telehealth: Payer: Self-pay | Admitting: Internal Medicine

## 2021-10-15 DIAGNOSIS — R197 Diarrhea, unspecified: Secondary | ICD-10-CM | POA: Diagnosis not present

## 2021-10-15 DIAGNOSIS — R195 Other fecal abnormalities: Secondary | ICD-10-CM

## 2021-10-15 LAB — BASIC METABOLIC PANEL
BUN: 13 mg/dL (ref 7–25)
CO2: 26 mmol/L (ref 20–32)
Calcium: 9.1 mg/dL (ref 8.6–10.4)
Chloride: 105 mmol/L (ref 98–110)
Creat: 0.82 mg/dL (ref 0.60–0.95)
Glucose, Bld: 100 mg/dL (ref 65–139)
Potassium: 4.1 mmol/L (ref 3.5–5.3)
Sodium: 138 mmol/L (ref 135–146)

## 2021-10-15 LAB — CBC WITH DIFFERENTIAL/PLATELET
Absolute Monocytes: 290 cells/uL (ref 200–950)
Basophils Absolute: 20 cells/uL (ref 0–200)
Basophils Relative: 0.3 %
Eosinophils Absolute: 20 cells/uL (ref 15–500)
Eosinophils Relative: 0.3 %
HCT: 37.8 % (ref 35.0–45.0)
Hemoglobin: 12.6 g/dL (ref 11.7–15.5)
Lymphs Abs: 594 cells/uL — ABNORMAL LOW (ref 850–3900)
MCH: 29.1 pg (ref 27.0–33.0)
MCHC: 33.3 g/dL (ref 32.0–36.0)
MCV: 87.3 fL (ref 80.0–100.0)
MPV: 9.8 fL (ref 7.5–12.5)
Monocytes Relative: 4.4 %
Neutro Abs: 5676 cells/uL (ref 1500–7800)
Neutrophils Relative %: 86 %
Platelets: 195 10*3/uL (ref 140–400)
RBC: 4.33 10*6/uL (ref 3.80–5.10)
RDW: 12.9 % (ref 11.0–15.0)
Total Lymphocyte: 9 %
WBC: 6.6 10*3/uL (ref 3.8–10.8)

## 2021-10-15 NOTE — Telephone Encounter (Signed)
Pt wanted to proceed with EGD/-/+DIL that was previously scheduled for tomorrow at 11:30am. Pt wanted to do procedure tomorrow if possible. Procedure put back on for 11:30am tomorrow. Advised her to arrive at 10:00am. Reviewed instructions. Orders entered. ?

## 2021-10-15 NOTE — Telephone Encounter (Signed)
Pt was instructed to ask for it from the lab. ?

## 2021-10-15 NOTE — Telephone Encounter (Signed)
Noted ? ?Linda Stein has handled already ?

## 2021-10-15 NOTE — Telephone Encounter (Signed)
PATIENT CALLED AND SAID SHE WENT FOR HER LABS BUT THERE WAS NO STOOL SAMPLE KIT FOR HER TO PICK UP.  DOES SHE GET THAT HERE? ?

## 2021-10-15 NOTE — Telephone Encounter (Signed)
Spoke with patient and informed her to go have labs and stool study done. Pt states that so far today she has not had any diarrhea/loose stools. Pt states that she is still worried about how dark they were and wants to proceed with labs. Last episode was last night.  ?

## 2021-10-16 ENCOUNTER — Ambulatory Visit (HOSPITAL_COMMUNITY)
Admission: RE | Admit: 2021-10-16 | Discharge: 2021-10-16 | Disposition: A | Discharge status: Home / Self Care | Payer: Medicare Other | Attending: Internal Medicine | Admitting: Internal Medicine

## 2021-10-16 ENCOUNTER — Ambulatory Visit (HOSPITAL_COMMUNITY): Admit: 2021-10-16 | Payer: Medicare Other

## 2021-10-16 ENCOUNTER — Encounter (HOSPITAL_COMMUNITY): Payer: Self-pay

## 2021-10-16 ENCOUNTER — Other Ambulatory Visit: Payer: Self-pay

## 2021-10-16 ENCOUNTER — Ambulatory Visit (HOSPITAL_COMMUNITY): Payer: Medicare Other | Admitting: Anesthesiology

## 2021-10-16 ENCOUNTER — Encounter (HOSPITAL_COMMUNITY)
Admission: RE | Disposition: A | Discharge status: Home / Self Care | Payer: Self-pay | Source: Home / Self Care | Attending: Internal Medicine

## 2021-10-16 ENCOUNTER — Ambulatory Visit (HOSPITAL_BASED_OUTPATIENT_CLINIC_OR_DEPARTMENT_OTHER): Payer: Medicare Other | Admitting: Anesthesiology

## 2021-10-16 DIAGNOSIS — Z79899 Other long term (current) drug therapy: Secondary | ICD-10-CM | POA: Diagnosis not present

## 2021-10-16 DIAGNOSIS — R12 Heartburn: Secondary | ICD-10-CM | POA: Diagnosis present

## 2021-10-16 DIAGNOSIS — K589 Irritable bowel syndrome without diarrhea: Secondary | ICD-10-CM | POA: Diagnosis not present

## 2021-10-16 DIAGNOSIS — R1310 Dysphagia, unspecified: Secondary | ICD-10-CM

## 2021-10-16 DIAGNOSIS — K219 Gastro-esophageal reflux disease without esophagitis: Secondary | ICD-10-CM | POA: Diagnosis not present

## 2021-10-16 DIAGNOSIS — K2950 Unspecified chronic gastritis without bleeding: Secondary | ICD-10-CM | POA: Diagnosis not present

## 2021-10-16 DIAGNOSIS — Z87891 Personal history of nicotine dependence: Secondary | ICD-10-CM | POA: Diagnosis not present

## 2021-10-16 DIAGNOSIS — Z7952 Long term (current) use of systemic steroids: Secondary | ICD-10-CM | POA: Insufficient documentation

## 2021-10-16 DIAGNOSIS — K2970 Gastritis, unspecified, without bleeding: Secondary | ICD-10-CM | POA: Diagnosis not present

## 2021-10-16 HISTORY — PX: BIOPSY: SHX5522

## 2021-10-16 HISTORY — PX: ESOPHAGOGASTRODUODENOSCOPY (EGD) WITH PROPOFOL: SHX5813

## 2021-10-16 HISTORY — DX: Diarrhea, unspecified: R19.7

## 2021-10-16 SURGERY — ESOPHAGOGASTRODUODENOSCOPY (EGD) WITH PROPOFOL
Anesthesia: General

## 2021-10-16 SURGERY — ESOPHAGOGASTRODUODENOSCOPY (EGD) WITH PROPOFOL
Anesthesia: Monitor Anesthesia Care

## 2021-10-16 MED ORDER — PROPOFOL 10 MG/ML IV BOLUS
INTRAVENOUS | Status: DC | PRN
Start: 2021-10-16 — End: 2021-10-16
  Administered 2021-10-16: 100 mg via INTRAVENOUS

## 2021-10-16 MED ORDER — LIDOCAINE HCL (CARDIAC) PF 100 MG/5ML IV SOSY
PREFILLED_SYRINGE | INTRAVENOUS | Status: DC | PRN
Start: 2021-10-16 — End: 2021-10-16
  Administered 2021-10-16: 50 mg via INTRAVENOUS

## 2021-10-16 MED ORDER — LACTATED RINGERS IV SOLN
INTRAVENOUS | Status: DC | PRN
Start: 2021-10-16 — End: 2021-10-16

## 2021-10-16 NOTE — Interval H&P Note (Signed)
History and Physical Interval Note: ? ?10/16/2021 ?10:16 AM ? ?Linda Stein  has presented today for surgery, with the diagnosis of GERD, dysphagia.  The various methods of treatment have been discussed with the patient and family. After consideration of risks, benefits and other options for treatment, the patient has consented to  Procedure(s) with comments: ?ESOPHAGOGASTRODUODENOSCOPY (EGD) WITH PROPOFOL (N/A) - 11:30am ?BALLOON DILATION (N/A) as a surgical intervention.  The patient's history has been reviewed, patient examined, no change in status, stable for surgery.  I have reviewed the patient's chart and labs.  Questions were answered to the patient's satisfaction.   ? ? ?Eloise Harman ? ? ?

## 2021-10-16 NOTE — Discharge Instructions (Addendum)
EGD ?Discharge instructions ?Please read the instructions outlined below and refer to this sheet in the next few weeks. These discharge instructions provide you with general information on caring for yourself after you leave the hospital. Your doctor may also give you specific instructions. While your treatment has been planned according to the most current medical practices available, unavoidable complications occasionally occur. If you have any problems or questions after discharge, please call your doctor. ?ACTIVITY ?You may resume your regular activity but move at a slower pace for the next 24 hours.  ?Take frequent rest periods for the next 24 hours.  ?Walking will help expel (get rid of) the air and reduce the bloated feeling in your abdomen.  ?No driving for 24 hours (because of the anesthesia (medicine) used during the test).  ?You may shower.  ?Do not sign any important legal documents or operate any machinery for 24 hours (because of the anesthesia used during the test).  ?NUTRITION ?Drink plenty of fluids.  ?You may resume your normal diet.  ?Begin with a light meal and progress to your normal diet.  ?Avoid alcoholic beverages for 24 hours or as instructed by your caregiver.  ?MEDICATIONS ?You may resume your normal medications unless your caregiver tells you otherwise.  ?WHAT YOU CAN EXPECT TODAY ?You may experience abdominal discomfort such as a feeling of fullness or ?gas? pains.  ?FOLLOW-UP ?Your doctor will discuss the results of your test with you.  ?SEEK IMMEDIATE MEDICAL ATTENTION IF ANY OF THE FOLLOWING OCCUR: ?Excessive nausea (feeling sick to your stomach) and/or vomiting.  ?Severe abdominal pain and distention (swelling).  ?Trouble swallowing.  ?Temperature over 101? F (37.8? C).  ?Rectal bleeding or vomiting of blood.  ? ? ?Your EGD revealed mild amount inflammation in your stomach.  I took biopsies of this to rule out infection with a bacteria called H. pylori.  Await pathology results, my  office will contact you.  I did not see any strictures in your esophagus.  I did not perform esophageal dilation today.  Continue on pantoprazole.  Follow-up with GI in 3 months.  We will follow-up with you on stool studies once I have these back.  If negative you can start taking Imodium for your diarrhea. ? ? ?I hope you have a great rest of your week! ? ?Elon Alas. Abbey Chatters, D.O. ?Gastroenterology and Hepatology ?[facility] ? ?

## 2021-10-16 NOTE — Op Note (Signed)
[facility] ?Patient Name: Linda Stein ?Procedure Date: 10/16/2021 10:49 AM ?MRN: [id number] ?Date of Birth: [birth year] ?Attending MD: Elon Alas. Abbey Chatters , DO ?CSN: [id number] ?Age: 88 ?Admit Type: Outpatient ?Procedure:                Upper GI endoscopy ?Indications:              Dysphagia, Heartburn ?Providers:                Elon Alas. Abbey Chatters, DO, Crystal Page, [place]  ?                          Wynonia Lawman, Technician ?Referring MD:              ?Medicines:                See the Anesthesia note for documentation of the  ?                          administered medications ?Complications:            No immediate complications. ?Estimated Blood Loss:     Estimated blood loss was minimal. ?Procedure:                Pre-Anesthesia Assessment: ?                          - The anesthesia plan was to use monitored  ?                          anesthesia care (MAC). ?                          After obtaining informed consent, the endoscope was  ?                          passed under direct vision. Throughout the  ?                          procedure, the patient's blood pressure, pulse, and  ?                          oxygen saturations were monitored continuously. The  ?                          GIF-H190 ([id number]) scope was introduced through the  ?                          mouth, and advanced to the second part of duodenum.  ?                          The upper GI endoscopy was accomplished without  ?                          difficulty. The patient tolerated the procedure  ?                          well. ?Scope In: 11:00:13 AM ?Scope Out:  11:03:02 AM ?Total Procedure Duration: 0 hours 2 minutes 49 seconds  ?Findings: ?     The Z-line was regular and was found 36 cm from the incisors. ?     Patchy mild inflammation characterized by erythema was found in the  ?     gastric body. Biopsies were taken with a cold forceps for Helicobacter  ?     pylori testing. ?     The duodenal bulb, first portion of the duodenum and  second portion of  ?     the duodenum were normal. ?     There is no endoscopic evidence of stenosis or stricture in the entire  ?     esophagus. ?Impression:               - Z-line regular, 36 cm from the incisors. ?                          - Gastritis. Biopsied. ?                          - Normal duodenal bulb, first portion of the  ?                          duodenum and second portion of the duodenum. ?Moderate Sedation: ?     Per Anesthesia Care ?Recommendation:           - Patient has a contact number available for  ?                          emergencies. The signs and symptoms of potential  ?                          delayed complications were discussed with the  ?                          patient. Return to normal activities tomorrow.  ?                          Written discharge instructions were provided to the  ?                          patient. ?                          - Resume previous diet. ?                          - Continue present medications. ?                          - Await pathology results. ?                          - Use Protonix (pantoprazole) 40 mg PO BID. ?                          - Return to GI clinic in 3 months. ?Procedure Code(s):        ---  Professional --- ?                          [phone], Esophagogastroduodenoscopy, flexible,  ?                          transoral; with biopsy, single or multiple ?Diagnosis Code(s):        --- Professional --- ?                          K29.70, Gastritis, unspecified, without bleeding ?                          R13.10, Dysphagia, unspecified ?                          R12, Heartburn ?CPT copyright 2019 [employer]. All rights reserved. ?The codes documented in this report are preliminary and upon coder review may  ?be revised to meet current compliance requirements. ?Elon Alas. Abbey Chatters, DO ?Elon Alas. Lathrop, DO ?10/16/2021 11:08:24 AM ?This report has been signed electronically. ?Number of Addenda: 0 ?

## 2021-10-16 NOTE — Transfer of Care (Signed)
Immediate Anesthesia Transfer of Care Note ? ?Patient: Linda Stein ? ?Procedure(s) Performed: ESOPHAGOGASTRODUODENOSCOPY (EGD) WITH PROPOFOL ?BIOPSY ? ?Patient Location: Endoscopy Unit ? ?Anesthesia Type:General ? ?Level of Consciousness: drowsy ? ?Airway & Oxygen Therapy: Patient Spontanous Breathing ? ?Post-op Assessment: Report given to RN and Post -op Vital signs reviewed and stable ? ?Post vital signs: Reviewed and stable ? ?Last Vitals:  ?Vitals Value Taken Time  ?BP 113/66 10/16/21 1107  ?Temp 36.6 ?C 10/16/21 1107  ?Pulse 72 10/16/21 1107  ?Resp 15 10/16/21 1107  ?SpO2 96 % 10/16/21 1107  ? ? ?Last Pain:  ?Vitals:  ? 10/16/21 1107  ?TempSrc: Oral  ?PainSc: 0-No pain  ?   ? ?Patients Stated Pain Goal: 4 (10/16/21 1009) ? ?Complications: No notable events documented. ?

## 2021-10-16 NOTE — Anesthesia Postprocedure Evaluation (Signed)
Anesthesia Post Note ? ?Patient: KAYLENE DAWN ? ?Procedure(s) Performed: ESOPHAGOGASTRODUODENOSCOPY (EGD) WITH PROPOFOL ?BIOPSY ? ?Patient location during evaluation: Phase II ?Anesthesia Type: General ?Level of consciousness: awake ?Pain management: pain level controlled ?Vital Signs Assessment: post-procedure vital signs reviewed and stable ?Respiratory status: spontaneous breathing and respiratory function stable ?Cardiovascular status: blood pressure returned to baseline and stable ?Postop Assessment: no headache and no apparent nausea or vomiting ?Anesthetic complications: no ?Comments: Late entry ? ? ?No notable events documented. ? ? ?Last Vitals:  ?Vitals:  ? 10/16/21 1009 10/16/21 1107  ?BP: (!) 160/66 113/66  ?Pulse: 88 72  ?Resp: 16 15  ?Temp: 36.6 ?C 36.6 ?C  ?SpO2: 95% 96%  ?  ?Last Pain:  ?Vitals:  ? 10/16/21 1107  ?TempSrc: Oral  ?PainSc: 0-No pain  ? ? ?  ?  ?  ?  ?  ?  ? ?Louann Sjogren ? ? ? ? ?

## 2021-10-16 NOTE — Anesthesia Preprocedure Evaluation (Signed)
Anesthesia Evaluation  ?Patient identified by MRN, date of birth, ID band ?Patient awake ? ? ? ?Reviewed: ?Allergy & Precautions, H&P , NPO status , Patient's Chart, lab work & pertinent test results, reviewed documented beta blocker date and time  ? ?Airway ?Mallampati: II ? ?TM Distance: >3 FB ?Neck ROM: full ? ? ? Dental ?no notable dental hx. ? ?  ?Pulmonary ?neg pulmonary ROS, former smoker,  ?  ?Pulmonary exam normal ?breath sounds clear to auscultation ? ? ? ? ? ? Cardiovascular ?Exercise Tolerance: Good ?negative cardio ROS ? ? ?Rhythm:regular Rate:Normal ? ? ?  ?Neuro/Psych ?negative neurological ROS ? negative psych ROS  ? GI/Hepatic ?Neg liver ROS, GERD  Medicated,  ?Endo/Other  ?negative endocrine ROS ? Renal/GU ?Renal disease  ?negative genitourinary ?  ?Musculoskeletal ? ? Abdominal ?  ?Peds ? Hematology ?negative hematology ROS ?(+)   ?Anesthesia Other Findings ??  The study is normal. The study is low risk. ??  No ST deviation was noted. ??  LV perfusion is normal. There is no evidence of ischemia. There is no evidence of infarction. ??  Left ventricular function is normal. End diastolic cavity size is normal. End systolic cavity size is normal. ?? ?Normal resting and stress perfusion. No ischemia or infarction EF ?68% ? Reproductive/Obstetrics ?negative OB ROS ? ?  ? ? ? ? ? ? ? ? ? ? ? ? ? ?  ?  ? ? ? ? ? ? ? ? ?Anesthesia Physical ?Anesthesia Plan ? ?ASA: 2 ? ?Anesthesia Plan: General  ? ?Post-op Pain Management:   ? ?Induction:  ? ?PONV Risk Score and Plan: Propofol infusion ? ?Airway Management Planned:  ? ?Additional Equipment:  ? ?Intra-op Plan:  ? ?Post-operative Plan:  ? ?Informed Consent: I have reviewed the patients History and Physical, chart, labs and discussed the procedure including the risks, benefits and alternatives for the proposed anesthesia with the patient or authorized representative who has indicated his/her understanding and acceptance.   ? ? ? ?Dental Advisory Given ? ?Plan Discussed with: CRNA ? ?Anesthesia Plan Comments:   ? ? ? ? ? ? ?Anesthesia Quick Evaluation ? ?

## 2021-10-18 ENCOUNTER — Telehealth: Payer: Self-pay | Admitting: *Deleted

## 2021-10-18 NOTE — Telephone Encounter (Signed)
Per Dr. Abbey Chatters let her know biopsies showed gastritis but the test of H pylori is still pending. Her stool studies are still being processed as well. Continue on pantoprazole. ? ?Patient aware of results. She voiced understanding. She thinks she may be turning a corner. Taking imodium AD now.  ?

## 2021-10-18 NOTE — Telephone Encounter (Signed)
Please let patient know her biopsies show gastritis.  Test for H. pylori is still pending.  Continue on pantoprazole.  Her stool studies are still being processed.  Thank you ?

## 2021-10-18 NOTE — Telephone Encounter (Signed)
Patient called in asking for her recent test results. Message sent to Dr. Abbey Chatters ?

## 2021-10-21 ENCOUNTER — Telehealth: Payer: Self-pay | Admitting: Internal Medicine

## 2021-10-21 ENCOUNTER — Encounter (HOSPITAL_COMMUNITY): Payer: Self-pay | Admitting: Internal Medicine

## 2021-10-21 LAB — SURGICAL PATHOLOGY

## 2021-10-21 NOTE — Telephone Encounter (Signed)
noted 

## 2021-10-21 NOTE — Telephone Encounter (Signed)
Pt called again stating that she hasn't heard from you and I advised the pt that it more than likely be tomorrow and she asked should she go to the ER. I advised the pt if she feels like she needs to go be seen for diarrhea and tenting of her skin (which she just told me about) that I couldn't advise but she is more than welcome if this would ease her mind. She also states that you can call her after 5pm ?

## 2021-10-21 NOTE — Telephone Encounter (Signed)
[phone] please call patient, she states that she is still having issues with diarrhea   ?

## 2021-10-21 NOTE — Telephone Encounter (Signed)
I am still waiting patient's stool studies.  States "in process."  Can we call the lab and see what the hold-up is?  She dropped it off last week.  Thank you ?

## 2021-10-21 NOTE — Telephone Encounter (Signed)
See other phone note

## 2021-10-21 NOTE — Telephone Encounter (Signed)
PATIENT CALLED AGAIN ABOUT THE SAME ISSUE  ?

## 2021-10-21 NOTE — Telephone Encounter (Signed)
Pt phoned X 2 regarding having diarrhea x one week without any relief. She states today she has had diarrhea 5 or 6 times, no abd pain with it. She states after one more pill has taken the maximum dosage allowed in a day's time. She has also asked regarding her stool culture. Please advise ?

## 2021-10-22 ENCOUNTER — Telehealth: Payer: Self-pay | Admitting: Internal Medicine

## 2021-10-22 NOTE — Telephone Encounter (Signed)
Phoned and spoke to [facility] and was advised she will call customer service and find out what the problem is and call me back as soon as she finds out ?

## 2021-10-22 NOTE — Telephone Encounter (Signed)
Pt had one of her church members who is a nurse to call the office asking for her results. She was told unless she was the POA or on the release of medical information form that we can not speak to her about the patient's care.   ?  ?  ?Note   ? ?

## 2021-10-22 NOTE — Telephone Encounter (Signed)
See other note

## 2021-10-22 NOTE — Telephone Encounter (Signed)
Pt had one of her church members who is a nurse to call the office asking for her results. She was told unless she was the POA or on the release of medical information form that we can not speak to her about the patient's care.  ?

## 2021-10-22 NOTE — Telephone Encounter (Signed)
Pt called, I advised the pt once again that I have not heard anything from [facility] that they were working on finding out what happened. Advised the pt that I will call her when I get the results and that Dr. Abbey Chatters is aware that this has happened.  ?

## 2021-10-22 NOTE — Telephone Encounter (Signed)
Linda Stein from [facility] called and advised me that the test code [phone] was a invalid code. So they had spoke with Linda Stein. On 10-17-21 and she ok'ed them to use the code [phone] for the test. Once this was done the specimen was sent out on 3-10 to a lab in [place]. (Specimen with that code could not be ran locally and it will take 6 days as a turn around time to get results). Linda Stein for her help. ? ? ?Phoned and spoke with Dr. Abbey Chatters and advised him what was going on with the pt continuously calling here and I was advised by Benjamine Mola that she had called there to [facility] inquiring of the specimen. I advised Dr. Abbey Chatters will it be ok to advised ER visit for her if she cannot or has other issues to arrive with the diarrhea. The pt advised me yesterday that she may go to the ER for antibiotic. I had already advised the pt that we cannot prescribe anything until we know what we are dealing with. ?

## 2021-10-22 NOTE — Telephone Encounter (Signed)
[phone] patient called again needing to speak to dr Reola Mosher nurse.  She is still having the same problems.  ?

## 2021-10-22 NOTE — Telephone Encounter (Signed)
See other phone note

## 2021-10-22 NOTE — Telephone Encounter (Signed)
Pt has called again this morning asking for Dr Ave Filter nurse. I told her that she was at lunch. Pt said a friend of hers called earlier but we wouldn't speak to her and I explained why. Pt said her friend went on the computer and said [facility] doesn't have any requests for her stool test. I told her that Coralyn Helling was in contact with the labs and is trying to get her results and she was aware and would call her to let her know. [phone] ?

## 2021-10-22 NOTE — Telephone Encounter (Signed)
Pt has called again this morning asking for Dr Ave Filter nurse. I told her that she was at lunch. Pt said a friend of hers called earlier but we wouldn't speak to her and I explained why. Pt said her friend went on the computer and said [facility] doesn't have any requests for her stool test. I told her that Coralyn Helling was in contact with the labs and is trying to get her results and she was aware and would call her to let her know. [phone]  ?  ?  ?Note   ? ?

## 2021-10-23 ENCOUNTER — Ambulatory Visit: Payer: Medicare Other | Admitting: Internal Medicine

## 2021-10-23 NOTE — Telephone Encounter (Signed)
See other note

## 2021-10-23 NOTE — Telephone Encounter (Signed)
error 

## 2021-10-23 NOTE — Telephone Encounter (Signed)
I called and spoke with patient.  Continues to have diarrhea.  She is anxious about her stool studies which are still pending.  Recommended she continue to take Imodium.  She states she is staying well-hydrated.  No weakness fatigue.  Nausea or vomiting.  No abdominal pain.  Further recommendations after stool studies have resulted ?

## 2021-10-23 NOTE — Telephone Encounter (Signed)
Hurshel Keys K, DO  You 14 minutes ago (2:25 PM)  ? ?CC ?I called and spoke with patient.  Continues to have diarrhea.  She is anxious about her stool studies which are still pending.  Recommended she continue to take Imodium.  She states she is staying well-hydrated.  No weakness fatigue.  Nausea or vomiting.  No abdominal pain.  Further recommendations after stool studies have resulted  ?  ?  ?Note   ? ?

## 2021-10-24 NOTE — Telephone Encounter (Signed)
Hurshel Keys K, DO  You 14 minutes ago (2:25 PM)  ?  ?CC ?   ?I called and spoke with patient.  Continues to have diarrhea.  She is anxious about her stool studies which are still pending.  Recommended she continue to take Imodium.  She states she is staying well-hydrated.  No weakness fatigue.  Nausea or vomiting.  No abdominal pain.  Further recommendations after stool studies have resulted  ?  ?    ?Note    ?  ? ?

## 2021-10-25 LAB — TEST AUTHORIZATION: TEST CODE:: 38470

## 2021-10-25 LAB — GASTROINTESTINAL PATHOGEN PANEL PCR

## 2021-10-25 LAB — GASTROINTESTINAL PATHOGEN PNL

## 2021-10-28 ENCOUNTER — Telehealth: Payer: Self-pay

## 2021-10-28 ENCOUNTER — Telehealth: Payer: Self-pay | Admitting: Internal Medicine

## 2021-10-28 DIAGNOSIS — R197 Diarrhea, unspecified: Secondary | ICD-10-CM

## 2021-10-28 NOTE — Telephone Encounter (Signed)
Please explain to patient what happened. If she is still having diarrhea, she will need stool studies repeated. I will go ahead and order this to be done at [facility] instead of [facility] so we do not have issues again.  ?

## 2021-10-28 NOTE — Telephone Encounter (Signed)
Dr Abbey Chatters, ?I spoke with Benjamine Mola @ [facility] today. (The pt had phoned and LMOVM on my lunch regarding her results). Well [facility] has had some changes in their test codes and facilities wasn't aware of this until it was too late for some. Benjamine Mola was contacted by the lab in [place] and the specimen was not held to be tested. Test code [phone] is no longer good. We are instructed to use 10/8468 but it does not include Giardia and Cryto.  PCR -[id number] is in a different container under test directory. So the Dr's are instructed to type in [facility] and read all the different codes to make sure of whichever code you want tests for the things you are looking for. This pt will need to re-collect and depending upon when it is done it will take 5 to 7 business days to even turn around. So in other words this has to be explained to the pt and then she has to agree to do the specimen again. ?Please advise because the pt has already phoned once today.I have some documentation on this that was sent to [place]. ?

## 2021-10-30 ENCOUNTER — Telehealth: Payer: Self-pay | Admitting: Internal Medicine

## 2021-10-30 NOTE — Telephone Encounter (Signed)
Patient called because she has not gotten any results yet from any of the tests she took  ?

## 2021-10-30 NOTE — Telephone Encounter (Signed)
Please explain to patient what happened. If she is still having diarrhea, she will need stool studies repeated. I will go ahead and order this to be done at [facility] instead of [facility] so we do not have issues again.   ?  ? ?

## 2021-10-30 NOTE — Telephone Encounter (Signed)
Phoned and LMOVM of the pt to return the call ?

## 2021-10-31 NOTE — Telephone Encounter (Signed)
Dr Abbey Chatters ?Pt returned the call and I explained to her what happened and she thought it was [place] who dropped the ball and I told her no ma'am it was not. We were not aware of the changes that [place] had in place and we had 8 to 9 conversations regarding this even to trying to speak to her. She stated she never received a call because she keeps her phone with her all the time. I advised her we had dates and times of this , pt changed  the subject. But the pt's diarrhea ceased days ago but she was advised if diarrhea happens again we will need to send her to [place] to have them completed. Pt did agree to have it done there if there be a next time. ?

## 2021-11-05 ENCOUNTER — Telehealth: Payer: Self-pay | Admitting: Internal Medicine

## 2021-11-05 NOTE — Telephone Encounter (Signed)
Returned the pt's call and LMOVM for the pt to return call ?

## 2021-11-05 NOTE — Telephone Encounter (Signed)
Pt asked for Dr Ave Filter nurse to call her. [phone] ?

## 2021-11-07 NOTE — Telephone Encounter (Signed)
Pt called stating she is having diarrhea again and I instructed her to go to [place] to get her container for her specimen. Pt also wants the results of her pathology report. Please advise ?

## 2021-11-07 NOTE — Telephone Encounter (Signed)
Agree with patient dropping off stool specimen at [place].  I addressed her pathology reports previously.  Path showed chronic gastritis/inflammation of the stomach.  Further stains were negative for H. pylori so maybe that is why she is inquiring.  Continue on pantoprazole.  Thank you ?

## 2021-11-07 NOTE — Telephone Encounter (Signed)
Phoned and LMOVM of the pt to return call. 

## 2021-11-07 NOTE — Telephone Encounter (Signed)
FYI: Dr. Abbey Chatters ? ?Phoned and spoke with the pt and advised of her result note and recommendations of specimen going back to [place]. Also there was a discussion of "diarrhea" and that formed stools cannot be tested because its being coded for diarrhea on the Dr's end and we just can't test for anything with the same code. We go off of what the pt tells [place]. After a few more minutes of discussion regarding diarrhea pt expressed understanding. ?

## 2021-11-27 ENCOUNTER — Ambulatory Visit: Payer: Medicare Other | Admitting: Gastroenterology

## 2021-11-27 ENCOUNTER — Encounter: Payer: Self-pay | Admitting: Gastroenterology

## 2021-11-27 VITALS — BP 128/70 | HR 63 | Temp 100.0°F | Ht 62.0 in | Wt 112.0 lb

## 2021-11-27 DIAGNOSIS — R194 Change in bowel habit: Secondary | ICD-10-CM | POA: Insufficient documentation

## 2021-11-27 DIAGNOSIS — R197 Diarrhea, unspecified: Secondary | ICD-10-CM

## 2021-11-27 DIAGNOSIS — K219 Gastro-esophageal reflux disease without esophagitis: Secondary | ICD-10-CM

## 2021-11-27 NOTE — Patient Instructions (Addendum)
Start a good probiotic and take for 1-2 months to improve your digestive health. Brands like Align or Mathis Bud are great options but can be pricey. [employer] is another which is more affordable. ?Let me know if your stools become loose again or you feel like they have not completely normalized.  ?Continue pantoprazole '40mg'$  once daily.  ?Monitor your weight, call if any further weight loss.  ?Return to the office as needed.  ?

## 2021-11-27 NOTE — Progress Notes (Signed)
? ? ? ?GI Office Note   ? ?Referring Provider: Rogers Nation, MD ?Primary Care Physician:  Viborg Nation, MD  ?Primary Gastroenterologist: Elon Alas. Abbey Chatters, DO ? ? ?Chief Complaint  ? ?Chief Complaint  ?Patient presents with  ? Follow-up  ?  Pt is doing better since having issues with diarrhea. States that she noticed mucous like substance in her bm yesterday.   ? ? ?History of Present Illness  ? ?Linda Stein is a 88 y.o. female presenting today for follow-up.  Patient seen back in February for GERD.  She was on omeprazole with breakthrough symptoms, switched to pantoprazole twice daily with some improvement.  Completed EGD as outlined below.  Shortly after office visit she called in with complaints of explosive diarrhea. ? ?Diarrhea for two weeks, black and runny. Happened before the EGD. No ill contacts. Tried to get stool studies but [facility] started sending out GI pathogen panels with new test codes which caused a delay and ultimately we never received any results as her specimen was not held to be processed after being submitted with old test codes.   ? ?Diarrhea lasted for about 2 weeks but did have some self limited diarrhea since then. Overall she feels her stools are back to her normal. Her stomach is still a bit gassy and noisy first thing in the mornings. She has cut out junk food. Decreased her portion size. States her weight is down about 7 pounds but seems to have stabilized. No melena, brbpr. Intermittent mid abdominal pain, doesn't last long. No sure if hunger pain. Noted first after the diarrhea. Heartburn much better.  ? ?EGD March 2023: ? ?Z-line regular, 36 cm from the incisors. ?- Gastritis. Biopsied. ?- Normal duodenal bulb, first portion of the duodenum and second portion of the ?duodenum. ? ? ?Medications  ? ?Current Outpatient Medications  ?Medication Sig Dispense Refill  ? acetaminophen (TYLENOL) 500 MG tablet Take 1,000 mg by mouth every 6 (six) hours as needed for mild pain.     ? Cholecalciferol (VITAMIN D3 PO) Take 5,000 Units by mouth in the morning.    ? pantoprazole (PROTONIX) 40 MG tablet Take 40 mg by mouth in the morning.    ? predniSONE (DELTASONE) 1 MG tablet Take 4 mg by mouth in the morning.    ? ?No current facility-administered medications for this visit.  ? ? ?Allergies  ? ?Allergies as of 11/27/2021 - Review Complete 11/27/2021  ?Allergen Reaction Noted  ? Sulfa antibiotics Other (See Comments) 06/07/2014  ?  ? ?Review of Systems  ? ?General: Negative for anorexia, fever, chills, fatigue, weakness.  See HPI. ?ENT: Negative for hoarseness, difficulty swallowing , nasal congestion. ?CV: Negative for chest pain, angina, palpitations, dyspnea on exertion, peripheral edema.  ?Respiratory: Negative for dyspnea at rest, dyspnea on exertion, cough, sputum, wheezing.  ?GI: See history of present illness. ?GU:  Negative for dysuria, hematuria, urinary incontinence, urinary frequency, nocturnal urination.  ?Endo: Negative for unusual weight change.  ?   ?Physical Exam  ? ?BP 128/70 (BP Location: Left Arm, Patient Position: Sitting, Cuff Size: Normal)   Pulse 63   Temp 100 ?F (37.8 ?C) (Temporal)   Ht '5\' 2"'$  (1.575 m)   Wt 112 lb (50.8 kg)   SpO2 100%   BMI 20.49 kg/m?  ?  ?General: Well-nourished, well-developed in no acute distress.  ?Eyes: No icterus. ?Mouth: Oropharyngeal mucosa moist and pink , no lesions erythema or exudate. ?Abdomen: Bowel sounds are normal, nontender,  nondistended, no hepatosplenomegaly or masses,  ?no abdominal bruits or hernia , no rebound or guarding.  ?Rectal: not performed  ?Extremities: No lower extremity edema. No clubbing or deformities. ?Neuro: Alert and oriented x 4   ?Skin: Warm and dry, no jaundice.   ?Psych: Alert and cooperative, normal mood and affect. ? ?Labs  ? ?Lab Results  ?Component Value Date  ? CREATININE 0.82 10/15/2021  ? BUN 13 10/15/2021  ? NA 138 10/15/2021  ? K 4.1 10/15/2021  ? CL 105 10/15/2021  ? CO2 26 10/15/2021  ? ?Lab  Results  ?Component Value Date  ? WBC 6.6 10/15/2021  ? HGB 12.6 10/15/2021  ? HCT 37.8 10/15/2021  ? MCV 87.3 10/15/2021  ? PLT 195 10/15/2021  ? ? ?Imaging Studies  ? ?No results found. ? ?Assessment  ? ?GERD: doing much better. Taking pantoprazole once daily. Following antireflux measures. ? ?Diarrhea: unclear etiology. Has resolved. If recurrence would send stool for GI profile at [facility] only. She will monitor for recurrent symptoms. Monitor for further weight loss.  ? ? ?PLAN  ? ?Start a probiotic, take for 1 to 2 months. ?Monitor for any further weight loss. ?If stools do not completely normalize, she will let me know. ?Continue pantoprazole 40 mg daily. ?Return to the office as needed. ? ? ?Laureen Ochs. Mylen Mangan, MHS, PA-C ?[facility] ? ?

## 2021-12-24 DIAGNOSIS — H40013 Open angle with borderline findings, low risk, bilateral: Secondary | ICD-10-CM | POA: Diagnosis not present

## 2021-12-24 DIAGNOSIS — H524 Presbyopia: Secondary | ICD-10-CM | POA: Diagnosis not present

## 2021-12-25 ENCOUNTER — Telehealth: Payer: Self-pay

## 2021-12-25 DIAGNOSIS — A09 Infectious gastroenteritis and colitis, unspecified: Secondary | ICD-10-CM

## 2021-12-25 NOTE — Telephone Encounter (Signed)
Pt called stating that she is having diarrhea on and off again. Pt states that in the mornings she has a lot of gas and sometimes a small amount of stool will come out. This morning she had gas and when she went to the bathroom straight liquid came out that had a really foul smell. Pt stated that she doesn't feel bad but was wondering if you wanted to do a stool sample since the last time this occurred her stool was lost by [facility].  ?

## 2021-12-26 NOTE — Addendum Note (Signed)
Addended by: Mahala Menghini on: 12/26/2021 06:53 AM   Modules accepted: Orders

## 2021-12-26 NOTE — Telephone Encounter (Signed)
Pt was made aware and verbalized understanding.  

## 2021-12-26 NOTE — Telephone Encounter (Signed)
I put in order for gi profile and cdiff. You may have to release.

## 2021-12-30 DIAGNOSIS — A09 Infectious gastroenteritis and colitis, unspecified: Secondary | ICD-10-CM | POA: Diagnosis not present

## 2022-01-01 LAB — GI PROFILE, STOOL, PCR

## 2022-01-02 LAB — CLOSTRIDIUM DIFFICILE BY PCR: Toxigenic C. Difficile by PCR: NEGATIVE

## 2022-01-16 DIAGNOSIS — Z7952 Long term (current) use of systemic steroids: Secondary | ICD-10-CM | POA: Diagnosis not present

## 2022-01-16 DIAGNOSIS — M064 Inflammatory polyarthropathy: Secondary | ICD-10-CM | POA: Diagnosis not present

## 2022-01-16 DIAGNOSIS — M810 Age-related osteoporosis without current pathological fracture: Secondary | ICD-10-CM | POA: Diagnosis not present

## 2022-01-16 DIAGNOSIS — M353 Polymyalgia rheumatica: Secondary | ICD-10-CM | POA: Diagnosis not present

## 2022-02-03 ENCOUNTER — Encounter: Payer: Self-pay | Admitting: Gastroenterology

## 2022-02-03 ENCOUNTER — Ambulatory Visit (INDEPENDENT_AMBULATORY_CARE_PROVIDER_SITE_OTHER): Payer: Medicare Other | Admitting: Gastroenterology

## 2022-02-03 VITALS — BP 128/71 | HR 69 | Temp 97.7°F | Ht 61.0 in | Wt 114.6 lb

## 2022-02-03 DIAGNOSIS — K219 Gastro-esophageal reflux disease without esophagitis: Secondary | ICD-10-CM

## 2022-02-03 DIAGNOSIS — A09 Infectious gastroenteritis and colitis, unspecified: Secondary | ICD-10-CM

## 2022-02-14 DIAGNOSIS — Z13220 Encounter for screening for lipoid disorders: Secondary | ICD-10-CM | POA: Diagnosis not present

## 2022-02-14 DIAGNOSIS — Z1329 Encounter for screening for other suspected endocrine disorder: Secondary | ICD-10-CM | POA: Diagnosis not present

## 2022-02-14 DIAGNOSIS — K219 Gastro-esophageal reflux disease without esophagitis: Secondary | ICD-10-CM | POA: Diagnosis not present

## 2022-02-14 DIAGNOSIS — E559 Vitamin D deficiency, unspecified: Secondary | ICD-10-CM | POA: Diagnosis not present

## 2022-02-19 ENCOUNTER — Telehealth: Payer: Self-pay

## 2022-02-19 NOTE — Telephone Encounter (Signed)
Pt was made aware and verbalized understanding.  

## 2022-02-19 NOTE — Telephone Encounter (Signed)
Pt called stating that she had an issue yesterday where she messed her pad with stool unknowingly and had a loose stool this morning as well. Pt was diagnosed with norovirus on 12/30/21 and is wondering if she should still be having issues with her stools because of this. Pt states that she is also still taking the [employer]ips probiotic.

## 2022-02-19 NOTE — Telephone Encounter (Signed)
With only a couple episodes of loose stools, would continue to monitor for now. She could have some intermittent loose stools following norovirus, but she was doing very well when I saw her last time. Not sure if something she ate may have contributed to her loose stools this time. Recommend she monitor, keep a log of when the occasional loose stool occurs to see if there is a potential unidentified dietary trigger, and if she has any persistent diarrhea, please have her call back.   She can also try adding benefiber 2 teaspoons daily to help with stool form.

## 2022-02-21 DIAGNOSIS — R079 Chest pain, unspecified: Secondary | ICD-10-CM | POA: Diagnosis not present

## 2022-02-21 DIAGNOSIS — Z0001 Encounter for general adult medical examination with abnormal findings: Secondary | ICD-10-CM | POA: Diagnosis not present

## 2022-02-21 DIAGNOSIS — M1990 Unspecified osteoarthritis, unspecified site: Secondary | ICD-10-CM | POA: Diagnosis not present

## 2022-02-21 DIAGNOSIS — K219 Gastro-esophageal reflux disease without esophagitis: Secondary | ICD-10-CM | POA: Diagnosis not present

## 2022-03-11 ENCOUNTER — Telehealth: Payer: Self-pay

## 2022-03-11 NOTE — Telephone Encounter (Signed)
Patient called stating that she has been trying to keep a log of her bm's however it has been difficult. Pt states that her bm's will go from being normal to them not being normal. States that today her first bm was normal but then her second one there was a long trail of mucous. Later this afternoon she had an accident that she was unaware that she had until she went to the bathroom and wiped. Pt is still taking [employer]ips colon health and benefiber 2 teaspoons daily.

## 2022-03-11 NOTE — Telephone Encounter (Signed)
Is she having any watery stools? How many bowel movements per day? Sounds like her incontinent episode may have been a little stool seepage rather than full incontinence. Has she had any complete bowel incontinence?   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   She can try switching benefiber to metamucil to see if this will help any better than benefiber with bowel regularity.   If she continues to have problems, she will need to schedule an OV to re-evaluate.

## 2022-03-12 NOTE — Telephone Encounter (Signed)
Lmom for pt to return my call.  

## 2022-03-13 NOTE — Telephone Encounter (Signed)
Noted  

## 2022-03-13 NOTE — Telephone Encounter (Signed)
Pt states that she is not having any watery stools, she has one good bm and then she will notice the stool on her pads when she uses the bathroom throughout the day. Pt will monitor for now and will call back to make an appt if it continues to happen. Is going to change/hold off on fiber to see if that helps as well.

## 2022-03-23 NOTE — Progress Notes (Signed)
Referring Provider: [place] Nation, MD Primary Care Physician:   Nation, MD Primary GI Physician: Dr. Abbey Chatters  Chief Complaint  Patient presents with   Diarrhea    Has not had an episode since scheduling the appt. Pt is doing better.     HPI:   Linda Stein is a 88 y.o. female with GI history of GERD, diarrhea secondary to norovirus in May 2023, presenting today for follow-up.   Last seen in our office 02/03/2022.  She reported her diarrhea had resolved after about 1 week.  Bowels are back to normal.  She was taking Hardin Negus' colon health x 2 months.  No alarm symptoms.  GERD is well controlled on pantoprazole 40 mg daily.  No alarm symptoms.  Recommended continuing pantoprazole, ok to stop probiotic if she would like, and follow-up in 1 year or sooner if needed.   Patient called 02/19/22 reporting having a loose stool and an accident. Recommended monitoring for a little longer, keep a log of when occasional loose stools occur to see if there is a dietary trigger. Also recommended starting benefiber.   Patient called back on 8/1 reporting bowels fluctuating from normal to loose. Had some mucous in her stool. She denied watery stools stating she had a normal stool then would notice a litle stool on her pads when going to the bathroom. 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   Today:  Doing well overall. Since having norovirus, stools are "different". No diarrhea. Stools are soft/formed, but not as firm as they were prior to norovirus. No mushy stools. This morning she had a very normal stool, more firm and closer to her prior baseline. Occasionally notes a little mucous on the outside of the stool, but nothing significant. No abdominal pain, brbpr, melena, unintentional weight loss.   No other GI concerns.   Past Medical History:  Diagnosis Date   Atrophic vaginitis    Diarrhea    GERD  (gastroesophageal reflux disease)    History of kidney stones    Irritable bowel syndrome     Past Surgical History:  Procedure Laterality Date   BIOPSY  10/16/2021   Procedure: BIOPSY;  Surgeon: Eloise Harman, DO;  Location: [facility];  Service: Endoscopy;;   Cataract surgery  2021   CESAREAN SECTION  1962   ESOPHAGOGASTRODUODENOSCOPY (EGD) WITH PROPOFOL N/A 10/16/2021   Procedure: ESOPHAGOGASTRODUODENOSCOPY (EGD) WITH PROPOFOL;  Surgeon: Eloise Harman, DO;  Location: [facility];  Service: Endoscopy;  Laterality: N/A;  11:30am   NEPHROLITHOTOMY Right 03/20/2021   Procedure: FIRST STAGE RIGHT NEPHROLITHOTOMY PERCUTANEOUS WITH  SURGEON ACCESS CYSTOSCOPY RIGHT RETROGRADE;  Surgeon: Alexis Frock, MD;  Location: [facility];  Service: Urology;  Laterality: Right;   NEPHROLITHOTOMY Right 03/22/2021   Procedure: NEPHROLITHOTOMY PERCUTANEOUS SECOND LOOK, URETERAL STENT PLACEMENT,LEFT;  Surgeon: Alexis Frock, MD;  Location: [facility];  Service: Urology;  Laterality: Right;    Current Outpatient Medications  Medication Sig Dispense Refill   acetaminophen (TYLENOL) 500 MG tablet Take 1,000 mg by mouth every 6 (six) hours as needed for mild pain.     Cholecalciferol (VITAMIN D3 PO) Take 5,000 Units by mouth in the morning.     pantoprazole (PROTONIX) 40 MG tablet Take 40 mg by mouth in the morning.     predniSONE (DELTASONE) 1 MG tablet Take 4 mg by mouth in the morning.     Probiotic Product (PHILLIPS  COLON HEALTH) CAPS Take by mouth.     No current facility-administered medications for this visit.    Allergies as of 03/24/2022 - Review Complete 03/24/2022  Allergen Reaction Noted   Sulfa antibiotics Other (See Comments) 06/07/2014    Family History  Problem Relation Age of Onset   Other Mother        aneursym   Stroke Mother    Cancer Father    Other Son        hip replacement   Colon cancer Neg Hx     Social History   Socioeconomic History   Marital status: Widowed     Spouse name: Not on file   Number of children: Not on file   Years of education: Not on file   Highest education level: Not on file  Occupational History   Not on file  Tobacco Use   Smoking status: Former    Types: Cigarettes   Smokeless tobacco: Never  Vaping Use   Vaping Use: Never used  Substance and Sexual Activity   Alcohol use: No   Drug use: No   Sexual activity: Not Currently    Birth control/protection: Post-menopausal  Other Topics Concern   Not on file  Social History Narrative   Not on file   Social Determinants of Health   Financial Resource Strain: Not on file  Food Insecurity: Not on file  Transportation Needs: Not on file  Physical Activity: Not on file  Stress: Not on file  Social Connections: Not on file    Review of Systems: Gen: Denies fever, chills, cold or flu like symptoms, pre-syncope, or syncope.  CV: Denies chest pain, palpitations. Resp: Denies dyspnea, cough.  GI: See HPI Heme: See HPI  Physical Exam: BP 123/65 (BP Location: Left Arm, Patient Position: Sitting, Cuff Size: Normal)   Pulse 69   Temp (!) 97.5 F (36.4 C) (Temporal)   Ht 5' (1.524 m)   Wt 114 lb 6.4 oz (51.9 kg)   SpO2 98%   BMI 22.34 kg/m  General:   Alert and oriented. No distress noted. Pleasant and cooperative.  Head:  Normocephalic and atraumatic. Eyes:  Conjuctiva clear without scleral icterus. Heart:  S1, S2 present without murmurs appreciated. Lungs:  Clear to auscultation bilaterally. No wheezes, rales, or rhonchi. No distress.  Abdomen:  +BS, soft, non-tender and non-distended. No rebound or guarding. No HSM or masses noted. Msk:  Symmetrical without gross deformities. Normal posture. Extremities:  Without edema. Neurologic:  Alert and  oriented x4 Psych:  Normal mood and affect.    Assessment:  88 year old female with GI history of GERD, diarrhea secondary to norovirus in May 2023, presenting today for follow-up with complaint of change in bowel  habits.  Clinically, she has much improved since norovirus.  Diarrhea has completely resolved.  She reports her bowel habits have not quite returned to "normal", stating they are soft and formed, but not as firm as they used to be.  She has no alarm symptoms.  We discussed that it can take several months for her bowels to return to "normal" after infectious diarrhea or it could be that she has found her new "normal".  She will continue to monitor for now. I do not see any reason for additional testing at this time. Advised that she can try adding Metamucil if she would like as Benefiber previously caused looser stools. She will continue her probiotic as well per her preference.   Plan:  Counseled on bowel  habit fluctuations that can be considered "normal" after infectious diarrhea.  She will continue to monitor her stool and let us know if she has return of persistent watery diarrhea.  We discussed alarm features as well including brbpr, melena, unintentional weight loss. She will monitor for this.  Continue probiotic as this is her preference. Discussed that there is no data to support this.  May try metamucil. Stop if this causes loose stools.  Follow-up in 1 year.    Aliene Altes, PA-C [facility] 03/24/2022

## 2022-03-24 ENCOUNTER — Ambulatory Visit: Payer: Medicare Other | Admitting: Gastroenterology

## 2022-03-24 ENCOUNTER — Encounter: Payer: Self-pay | Admitting: Gastroenterology

## 2022-03-24 VITALS — BP 123/65 | HR 69 | Temp 97.5°F | Ht 60.0 in | Wt 114.4 lb

## 2022-03-24 DIAGNOSIS — R194 Change in bowel habit: Secondary | ICD-10-CM

## 2022-03-24 NOTE — Patient Instructions (Signed)
As we discussed, your minor fluctuation in your bowels is normal following an infectious diarrhea. It is very encouraging that your stools are now soft and formed. Although they are not firm like they used to be, it is ok. This may be your new normal or it may take several months to get back to your prior "normal".  If you would like to try metamucil, you can. If it causes loose stools, just stop taking it.   You can continue to monitor your bowel habits. If you have recurrent persistent watery diarrhea, please let us know.   Aliene Altes, PA-C [facility]

## 2022-04-29 DIAGNOSIS — M064 Inflammatory polyarthropathy: Secondary | ICD-10-CM | POA: Diagnosis not present

## 2022-04-29 DIAGNOSIS — Z7952 Long term (current) use of systemic steroids: Secondary | ICD-10-CM | POA: Diagnosis not present

## 2022-04-29 DIAGNOSIS — M1991 Primary osteoarthritis, unspecified site: Secondary | ICD-10-CM | POA: Diagnosis not present

## 2022-04-29 DIAGNOSIS — M353 Polymyalgia rheumatica: Secondary | ICD-10-CM | POA: Diagnosis not present

## 2022-05-26 ENCOUNTER — Ambulatory Visit: Payer: Medicare Other | Admitting: Podiatry

## 2022-05-26 ENCOUNTER — Encounter: Payer: Self-pay | Admitting: Podiatry

## 2022-05-26 DIAGNOSIS — D2372 Other benign neoplasm of skin of left lower limb, including hip: Secondary | ICD-10-CM

## 2022-05-26 DIAGNOSIS — D2371 Other benign neoplasm of skin of right lower limb, including hip: Secondary | ICD-10-CM

## 2022-05-26 NOTE — Progress Notes (Signed)
She presents today chief complaint of painful calluses plantar aspect of the bilateral foot.  Objective: Vital signs stable she is alert oriented x3 severe fat pad atrophy forefoot bilateral resulting in benign skin lesions to prominent plantarflexed metatarsals fourth right fifth left.  Assessment: Plantarflexed metatarsals resulting in bilateral benign skin lesions.   Plan: Debrided benign skin lesions and placed padding today follow-up with her as needed.

## 2022-06-10 DIAGNOSIS — N200 Calculus of kidney: Secondary | ICD-10-CM | POA: Diagnosis not present

## 2022-09-16 DIAGNOSIS — K080 Exfoliation of teeth due to systemic causes: Secondary | ICD-10-CM | POA: Diagnosis not present

## 2022-10-21 DIAGNOSIS — R600 Localized edema: Secondary | ICD-10-CM | POA: Diagnosis not present

## 2022-10-21 DIAGNOSIS — Z6821 Body mass index (BMI) 21.0-21.9, adult: Secondary | ICD-10-CM | POA: Diagnosis not present

## 2022-10-22 ENCOUNTER — Ambulatory Visit (HOSPITAL_COMMUNITY)
Admission: RE | Admit: 2022-10-22 | Discharge: 2022-10-22 | Disposition: A | Discharge status: Home / Self Care | Payer: Medicare Other | Source: Ambulatory Visit | Attending: Internal Medicine | Admitting: Internal Medicine

## 2022-10-22 ENCOUNTER — Other Ambulatory Visit (HOSPITAL_COMMUNITY): Payer: Self-pay | Admitting: Internal Medicine

## 2022-10-22 DIAGNOSIS — R600 Localized edema: Secondary | ICD-10-CM

## 2022-10-24 DIAGNOSIS — I878 Other specified disorders of veins: Secondary | ICD-10-CM | POA: Diagnosis not present

## 2022-10-24 DIAGNOSIS — Z6821 Body mass index (BMI) 21.0-21.9, adult: Secondary | ICD-10-CM | POA: Diagnosis not present

## 2022-10-28 DIAGNOSIS — M353 Polymyalgia rheumatica: Secondary | ICD-10-CM | POA: Diagnosis not present

## 2022-10-28 DIAGNOSIS — M1991 Primary osteoarthritis, unspecified site: Secondary | ICD-10-CM | POA: Diagnosis not present

## 2022-10-28 DIAGNOSIS — Z7952 Long term (current) use of systemic steroids: Secondary | ICD-10-CM | POA: Diagnosis not present

## 2022-10-28 DIAGNOSIS — M064 Inflammatory polyarthropathy: Secondary | ICD-10-CM | POA: Diagnosis not present

## 2022-11-07 DIAGNOSIS — K080 Exfoliation of teeth due to systemic causes: Secondary | ICD-10-CM | POA: Diagnosis not present

## 2022-11-25 DIAGNOSIS — K080 Exfoliation of teeth due to systemic causes: Secondary | ICD-10-CM | POA: Diagnosis not present

## 2022-12-04 ENCOUNTER — Ambulatory Visit
Admission: EM | Admit: 2022-12-04 | Discharge: 2022-12-04 | Disposition: A | Discharge status: Home / Self Care | Payer: Medicare Other | Attending: Family Medicine | Admitting: Family Medicine

## 2022-12-04 DIAGNOSIS — R21 Rash and other nonspecific skin eruption: Secondary | ICD-10-CM | POA: Diagnosis not present

## 2022-12-04 MED ORDER — METHYLPREDNISOLONE SODIUM SUCC 125 MG IJ SOLR
80.0000 mg | Freq: Once | INTRAMUSCULAR | Status: AC
  Administered 2022-12-04: 80 mg via INTRAMUSCULAR

## 2022-12-04 NOTE — Discharge Instructions (Signed)
Meds ordered this encounter  Medications   methylPREDNISolone sodium succinate (SOLU-MEDROL) 125 mg/2 mL injection 80 mg   You have had labs (blood work) sent today. We will call you with any significant abnormalities or if there is need to begin or change treatment or pursue further follow up.  You may also review your test results online through MyChart. If you do not have a MyChart account, instructions to sign up should be on your discharge paperwork.

## 2022-12-04 NOTE — ED Provider Notes (Signed)
[facility]   [id number] 12/04/22 Arrival Time: 1511  ASSESSMENT & PLAN:  1. Rash and nonspecific skin eruption    Suspect drug-related rash. Has stopped Plaquenil. Plans f/u with rheumatologist.  Meds ordered this encounter  Medications   methylPREDNISolone sodium succinate (SOLU-MEDROL) 125 mg/2 mL injection 80 mg   Pending: Labs Reviewed  CBC WITH DIFFERENTIAL/PLATELET    Follow-up Information     [place] [facility].   Specialty: Emergency Medicine Why: If symptoms worsen in any way. Contact information: [address] [id number] [place] [address] [phone]                Reviewed expectations re: course of current medical issues. Questions answered. Outlined signs and symptoms indicating need for more acute intervention. Patient verbalized understanding. After Visit Summary given.   SUBJECTIVE:  Linda Stein is a 89 y.o. female who presents with a skin complaint. Pt reports she has a rash on her torso front and back; noted past 24 hours; mild itching. Pt did start taking plaquenil x 1 month. Called her rheumatologist; told stop medicine; has not taken today. Denies fever/pain.   OBJECTIVE: Vitals:   12/04/22 1530  BP: 136/63  Pulse: 78  Resp: 18  Temp: 97.9 F (36.6 C)  TempSrc: Oral  SpO2: 98%    General appearance: alert; no distress HEENT: [place]; AT Neck: supple with FROM Lungs: clear to auscultation bilaterally Heart: regular Extremities: no edema; moves all extremities normally Skin: warm and dry; scattered papular erythematous rash over chest and back; few places on upper extremities; no pustules noted Psychological: alert and cooperative; normal mood and affect  Allergies  Allergen Reactions   Sulfa Antibiotics Other (See Comments)    Sores in mouth.    Past Medical History:  Diagnosis Date   Atrophic vaginitis    Diarrhea    GERD (gastroesophageal  reflux disease)    History of kidney stones    Irritable bowel syndrome    Social History   Socioeconomic History   Marital status: Widowed    Spouse name: Not on file   Number of children: Not on file   Years of education: Not on file   Highest education level: Not on file  Occupational History   Not on file  Tobacco Use   Smoking status: Former    Types: Cigarettes   Smokeless tobacco: Never  Vaping Use   Vaping Use: Never used  Substance and Sexual Activity   Alcohol use: No   Drug use: No   Sexual activity: Not Currently    Birth control/protection: Post-menopausal  Other Topics Concern   Not on file  Social History Narrative   Not on file   Social Determinants of Health   Financial Resource Strain: Not on file  Food Insecurity: Not on file  Transportation Needs: Not on file  Physical Activity: Not on file  Stress: Not on file  Social Connections: Not on file  Intimate Partner Violence: Not on file   Family History  Problem Relation Age of Onset   Other Mother        aneursym   Stroke Mother    Cancer Father    Other Son        hip replacement   Colon cancer Neg Hx    Past Surgical History:  Procedure Laterality Date   BIOPSY  10/16/2021   Procedure: BIOPSY;  Surgeon: Lanelle Bal, DO;  [facility];  Service: Endoscopy;;   Cataract surgery  2021   CESAREAN SECTION  1962   ESOPHAGOGASTRODUODENOSCOPY (EGD) WITH PROPOFOL N/A 10/16/2021   Procedure: ESOPHAGOGASTRODUODENOSCOPY (EGD) WITH PROPOFOL;  Surgeon: Lanelle Bal, DO;  Location: [facility];  Service: Endoscopy;  Laterality: N/A;  11:30am   NEPHROLITHOTOMY Right 03/20/2021   Procedure: FIRST STAGE RIGHT NEPHROLITHOTOMY PERCUTANEOUS WITH  SURGEON ACCESS CYSTOSCOPY RIGHT RETROGRADE;  Surgeon: Sebastian Ache, MD;  Location: [facility];  Service: Urology;  Laterality: Right;   NEPHROLITHOTOMY Right 03/22/2021   Procedure: NEPHROLITHOTOMY PERCUTANEOUS SECOND LOOK, URETERAL STENT  PLACEMENT,LEFT;  Surgeon: Sebastian Ache, MD;  Location: [facility];  Service: Urology;  Laterality: Right;      Mardella Layman, MD 12/04/22 [phone]

## 2022-12-04 NOTE — ED Triage Notes (Signed)
Pt reports she has a rash on her torso front and back that itches x 1 day.   Pt did start taking plaquenil x 1 month.

## 2022-12-05 DIAGNOSIS — R21 Rash and other nonspecific skin eruption: Secondary | ICD-10-CM | POA: Diagnosis not present

## 2022-12-05 DIAGNOSIS — Z6821 Body mass index (BMI) 21.0-21.9, adult: Secondary | ICD-10-CM | POA: Diagnosis not present

## 2022-12-05 LAB — CBC WITH DIFFERENTIAL/PLATELET
Basophils Absolute: 0 10*3/uL (ref 0.0–0.2)
Basos: 1 %
EOS (ABSOLUTE): 0.1 10*3/uL (ref 0.0–0.4)
Eos: 1 %
Hematocrit: 36.7 % (ref 34.0–46.6)
Hemoglobin: 12.4 g/dL (ref 11.1–15.9)
Immature Grans (Abs): 0 10*3/uL (ref 0.0–0.1)
Immature Granulocytes: 0 %
Lymphocytes Absolute: 0.5 10*3/uL — ABNORMAL LOW (ref 0.7–3.1)
Lymphs: 6 %
MCH: 29.7 pg (ref 26.6–33.0)
MCHC: 33.8 g/dL (ref 31.5–35.7)
MCV: 88 fL (ref 79–97)
Monocytes Absolute: 0.4 10*3/uL (ref 0.1–0.9)
Monocytes: 5 %
Neutrophils Absolute: 7.4 10*3/uL — ABNORMAL HIGH (ref 1.4–7.0)
Neutrophils: 87 %
Platelets: 203 10*3/uL (ref 150–450)
RBC: 4.17 x10E6/uL (ref 3.77–5.28)
RDW: 12.4 % (ref 11.7–15.4)
WBC: 8.5 10*3/uL (ref 3.4–10.8)

## 2022-12-06 ENCOUNTER — Other Ambulatory Visit: Payer: Self-pay

## 2022-12-06 ENCOUNTER — Encounter (HOSPITAL_COMMUNITY): Payer: Self-pay | Admitting: Emergency Medicine

## 2022-12-06 ENCOUNTER — Emergency Department (HOSPITAL_COMMUNITY)
Admission: EM | Admit: 2022-12-06 | Discharge: 2022-12-06 | Disposition: A | Discharge status: Home / Self Care | Payer: Medicare Other | Attending: Emergency Medicine | Admitting: Emergency Medicine

## 2022-12-06 DIAGNOSIS — R21 Rash and other nonspecific skin eruption: Secondary | ICD-10-CM | POA: Diagnosis not present

## 2022-12-06 LAB — CBC WITH DIFFERENTIAL/PLATELET
Abs Immature Granulocytes: 0.14 10*3/uL — ABNORMAL HIGH (ref 0.00–0.07)
Basophils Absolute: 0 10*3/uL (ref 0.0–0.1)
Basophils Relative: 0 %
Eosinophils Absolute: 0 10*3/uL (ref 0.0–0.5)
Eosinophils Relative: 0 %
HCT: 36.8 % (ref 36.0–46.0)
Hemoglobin: 12.2 g/dL (ref 12.0–15.0)
Immature Granulocytes: 1 %
Lymphocytes Relative: 2 %
Lymphs Abs: 0.3 10*3/uL — ABNORMAL LOW (ref 0.7–4.0)
MCH: 30 pg (ref 26.0–34.0)
MCHC: 33.2 g/dL (ref 30.0–36.0)
MCV: 90.6 fL (ref 80.0–100.0)
Monocytes Absolute: 0.3 10*3/uL (ref 0.1–1.0)
Monocytes Relative: 2 %
Neutro Abs: 15.7 10*3/uL — ABNORMAL HIGH (ref 1.7–7.7)
Neutrophils Relative %: 95 %
Platelets: 185 10*3/uL (ref 150–400)
RBC: 4.06 MIL/uL (ref 3.87–5.11)
RDW: 13.1 % (ref 11.5–15.5)
WBC: 16.5 10*3/uL — ABNORMAL HIGH (ref 4.0–10.5)
nRBC: 0 % (ref 0.0–0.2)

## 2022-12-06 LAB — BASIC METABOLIC PANEL
Anion gap: 6 (ref 5–15)
BUN: 19 mg/dL (ref 8–23)
CO2: 25 mmol/L (ref 22–32)
Calcium: 9 mg/dL (ref 8.9–10.3)
Chloride: 106 mmol/L (ref 98–111)
Creatinine, Ser: 0.68 mg/dL (ref 0.44–1.00)
GFR, Estimated: >60 mL/min (ref >60)
Glucose, Bld: 114 mg/dL — ABNORMAL HIGH (ref 70–99)
Potassium: 3.9 mmol/L (ref 3.5–5.1)
Sodium: 137 mmol/L (ref 135–145)

## 2022-12-06 MED ORDER — PREDNISONE 10 MG (21) PO TBPK: ORAL_TABLET | Freq: Every day | ORAL | 0 refills | Status: DC

## 2022-12-06 NOTE — ED Triage Notes (Signed)
Pt presents with rash on back and torso x 4 days starting to spread up neck and into head.. Pt has been seen at Urgent care and PCP this week with no change in symptoms. Pt denies any difficulty breathing. Pt thinks it may be coming from plaquenil that she started several weeks ago and has since stopped the medication. Pt denies any pain but itches .

## 2022-12-06 NOTE — ED Provider Triage Note (Signed)
Emergency Medicine Provider Triage Evaluation Note  Linda Stein , a 89 y.o. female  was evaluated in triage.  Pt complains of pruritic rash x 3 days.  She had been taking hydroxychloroquine, had been on for 3 weeks for her RA,  dc'd at rheumatologists recs,  rash worse despite steroid injection 3 days ago, prednisone taper and pepcid started ytd.  Review of Systems  Positive: Rash, itching Negative: Fever, n/v, sob, wheezing, facial or mouth swelling  Physical Exam  BP (!) 160/64 (BP Location: Right Arm)   Pulse 75   Temp 98 F (36.7 C) (Oral)   Resp 18   Ht 5\' 1"  (1.549 m)   Wt 52.6 kg   SpO2 100%   BMI 21.92 kg/m  Gen:   Awake, no distress   Resp:  Normal effort  MSK:   Moves extremities without difficulty  Other:  Near confluent non blanching rash on trunk,  less involving extremities  Medical Decision Making  Medically screening exam initiated at 1:14 PM.  Appropriate orders placed.  Richardson Chiquito was informed that the remainder of the evaluation will be completed by another provider, this initial triage assessment does not replace that evaluation, and the importance of remaining in the ED until their evaluation is complete.  General labs ordered.     Burgess Amor, PA-C 12/06/22 1316

## 2022-12-06 NOTE — Discharge Instructions (Signed)
Start this longer prednisone taper tomorrow as discussed.  Continue using your famotidine and your hydroxyzine for symptom relief.  Plan follow-up with your primary doctor for recheck in about a week if your symptoms are not starting to improve, returning here for any worsening or new symptoms.

## 2022-12-06 NOTE — ED Provider Notes (Signed)
[place] [facility] AT [facility] Provider Note   CSN: [id number] Arrival date & time: 12/06/22  1231     History  Chief Complaint  Patient presents with   Rash    Linda Stein is a 89 y.o. female with a history including polymyalgia rheumatica, osteoarthritis, GERD presenting for evaluation of a rash which has been persistent, started 5 days ago.  She had recently been on Plaquenil, prescribed by her rheumatologist, had been taking it for 1 month and stopped the medication at the rheumatologist advice the day her rash began.  She describes mildly erythematous red rash on her torso front and back, lesser involvement of her upper extremities.  She states the rash is starting to spread into her groin region as well.  She has had no fevers or chills, denies any other complaints.  She was seen at urgent care 3 days ago and was given a steroid injection which did not improve her symptoms, followed up with her primary provider who added a short prednisone taper 60 mg to 20 mg over 6 days, famotidine and hydroxyzine but still has not obtained any improvement, and states her rash has expanded per above.  She denies shortness of breath, wheezing, mouth or throat swelling.  Also denies nausea or vomiting, no abdominal pain.  The history is provided by the patient.       Home Medications Prior to Admission medications   Medication Sig Start Date End Date Taking? Authorizing Provider  predniSONE (STERAPRED UNI-PAK 21 TAB) 10 MG (21) TBPK tablet Take by mouth daily. Take 6 tabs by mouth daily  for 2 days, then 5 tabs for 2 days, then 4 tabs for 2 days, then 3 tabs for 2 days, 2 tabs for 2 days, then 1 tab by mouth daily for 2 days 12/06/22  Yes Chyla Schlender, Raynelle Fanning, PA-C  acetaminophen (TYLENOL) 500 MG tablet Take 1,000 mg by mouth every 6 (six) hours as needed for mild pain.    [provider]  Cholecalciferol (VITAMIN D3 PO) Take 5,000 Units by mouth in the morning.     [provider]  hydroxychloroquine (PLAQUENIL) 200 MG tablet Take 200 mg by mouth daily. 10/28/22   [provider]  pantoprazole (PROTONIX) 40 MG tablet Take 40 mg by mouth in the morning. 09/09/21   [provider]  Probiotic Product (PHILLIPS COLON HEALTH) CAPS Take by mouth.    [provider]      Allergies    Sulfa antibiotics    Review of Systems   Review of Systems  Constitutional:  Negative for chills and fever.  Respiratory:  Negative for shortness of breath and wheezing.   Skin:  Positive for rash.  Neurological:  Negative for numbness.  All other systems reviewed and are negative.   Physical Exam Updated Vital Signs BP (!) 155/70   Pulse 66   Temp 98.4 F (36.9 C)   Resp 19   Ht 5\' 1"  (1.549 m)   Wt 52.6 kg   SpO2 100%   BMI 21.92 kg/m  Physical Exam Constitutional:      General: She is not in acute distress.    Appearance: She is well-developed.  HENT:     Head: Normocephalic.  Cardiovascular:     Rate and Rhythm: Normal rate.  Pulmonary:     Effort: Pulmonary effort is normal.     Breath sounds: No wheezing.  Musculoskeletal:        General: Normal range  of motion.     Cervical back: Neck supple.  Skin:    Findings: Erythema and rash present. Rash is macular.     Comments: Slightly raised but macular in appearance rash, near confluent on her back and upper chest.  Less involved on her lower abdomen and her bilateral arms.  Anterior and upper extremity rash blanches, subtle blanching on the mid back where the rash is most intense.  There are no pustules, papules, excoriations.     ED Results / Procedures / Treatments   Labs (all labs ordered are listed, but only abnormal results are displayed) Labs Reviewed  CBC WITH DIFFERENTIAL/PLATELET - Abnormal; Notable for the following components:      Result Value   WBC 16.5 (*)    Neutro Abs 15.7 (*)    Lymphs Abs 0.3 (*)    Abs Immature Granulocytes 0.14 (*)    All  other components within normal limits  BASIC METABOLIC PANEL - Abnormal; Notable for the following components:   Glucose, Bld 114 (*)    All other components within normal limits    EKG None  Radiology No results found.  Procedures Procedures    Medications Ordered in ED Medications - No data to display  ED Course/ Medical Decision Making/ A&P                             Medical Decision Making Patient presenting with persistent rash suspected to be side effect of Plaquenil use.  She is holding this medication and was advised to continue holding it.  She was advised to continue her hydroxyzine and famotidine, she is placed on a longer 12-day prednisone taper 60 mg to 10 mg.  She is in no distress at time of discharge.  Advise close follow-up with her primary provider or her rheumatologist if symptoms persist or worsen.  She is afebrile, no respiratory distress or symptoms suggesting anaphylaxis.  Skin is intact, no sloughing, no generalized erythema suggesting Stevens-Johnson.  Amount and/or Complexity of Data Reviewed Labs: ordered.    Details: Labs reviewed, she does have a leukocytosis at 16.5, however I suspect this is secondary to steroid use.  Risk Prescription drug management.           Final Clinical Impression(s) / ED Diagnoses Final diagnoses:  Rash    Rx / DC Orders ED Discharge Orders          Ordered    predniSONE (STERAPRED UNI-PAK 21 TAB) 10 MG (21) TBPK tablet  Daily        12/06/22 1512              Burgess Amor, Cordelia Poche 12/06/22 1732    Eber Hong, MD 12/07/22 [phone]

## 2022-12-07 ENCOUNTER — Emergency Department (HOSPITAL_COMMUNITY)
Admission: EM | Admit: 2022-12-07 | Discharge: 2022-12-07 | Disposition: A | Discharge status: Home / Self Care | Payer: Medicare Other | Attending: Emergency Medicine | Admitting: Emergency Medicine

## 2022-12-07 ENCOUNTER — Other Ambulatory Visit: Payer: Self-pay

## 2022-12-07 DIAGNOSIS — N3000 Acute cystitis without hematuria: Secondary | ICD-10-CM | POA: Diagnosis not present

## 2022-12-07 DIAGNOSIS — I776 Arteritis, unspecified: Secondary | ICD-10-CM | POA: Insufficient documentation

## 2022-12-07 DIAGNOSIS — R21 Rash and other nonspecific skin eruption: Secondary | ICD-10-CM | POA: Diagnosis not present

## 2022-12-07 LAB — CBC
HCT: 40.2 % (ref 36.0–46.0)
Hemoglobin: 13.1 g/dL (ref 12.0–15.0)
MCH: 29.6 pg (ref 26.0–34.0)
MCHC: 32.6 g/dL (ref 30.0–36.0)
MCV: 90.7 fL (ref 80.0–100.0)
Platelets: 194 10*3/uL (ref 150–400)
RBC: 4.43 MIL/uL (ref 3.87–5.11)
RDW: 13.2 % (ref 11.5–15.5)
WBC: 16 10*3/uL — ABNORMAL HIGH (ref 4.0–10.5)
nRBC: 0 % (ref 0.0–0.2)

## 2022-12-07 LAB — URINALYSIS, ROUTINE W REFLEX MICROSCOPIC
Bilirubin Urine: NEGATIVE
Glucose, UA: NEGATIVE mg/dL
Ketones, ur: NEGATIVE mg/dL
Nitrite: NEGATIVE
Protein, ur: NEGATIVE mg/dL
Specific Gravity, Urine: 1.005 (ref 1.005–1.030)
pH: 7 (ref 5.0–8.0)

## 2022-12-07 LAB — BASIC METABOLIC PANEL
Anion gap: 8 (ref 5–15)
BUN: 24 mg/dL — ABNORMAL HIGH (ref 8–23)
CO2: 25 mmol/L (ref 22–32)
Calcium: 8.9 mg/dL (ref 8.9–10.3)
Chloride: 104 mmol/L (ref 98–111)
Creatinine, Ser: 0.83 mg/dL (ref 0.44–1.00)
GFR, Estimated: >60 mL/min (ref >60)
Glucose, Bld: 97 mg/dL (ref 70–99)
Potassium: 3.9 mmol/L (ref 3.5–5.1)
Sodium: 137 mmol/L (ref 135–145)

## 2022-12-07 LAB — SEDIMENTATION RATE: Sed Rate: 15 mm/hr (ref 0–22)

## 2022-12-07 LAB — PROTIME-INR
INR: 1 (ref 0.8–1.2)
Prothrombin Time: 12.9 seconds (ref 11.4–15.2)

## 2022-12-07 MED ORDER — DIPHENHYDRAMINE HCL 50 MG/ML IJ SOLN
25.0000 mg | Freq: Once | INTRAMUSCULAR | Status: AC
  Administered 2022-12-07: 25 mg via INTRAVENOUS
  Filled 2022-12-07: qty 1

## 2022-12-07 MED ORDER — METHYLPREDNISOLONE SODIUM SUCC 125 MG IJ SOLR
80.0000 mg | Freq: Once | INTRAMUSCULAR | Status: AC
  Administered 2022-12-07: 80 mg via INTRAVENOUS
  Filled 2022-12-07: qty 2

## 2022-12-07 MED ORDER — DIPHENHYDRAMINE HCL 25 MG PO TABS: 25.0000 mg | ORAL_TABLET | Freq: Four times a day (QID) | ORAL | 0 refills | Status: DC

## 2022-12-07 MED ORDER — CEPHALEXIN 500 MG PO CAPS: 500.0000 mg | ORAL_CAPSULE | Freq: Two times a day (BID) | ORAL | 0 refills | Status: DC

## 2022-12-07 MED ORDER — CEPHALEXIN 500 MG PO CAPS
500.0000 mg | ORAL_CAPSULE | Freq: Once | ORAL | Status: AC
  Administered 2022-12-07: 500 mg via ORAL
  Filled 2022-12-07: qty 1

## 2022-12-07 NOTE — ED Notes (Signed)
Pt instructed to get an urine sample  

## 2022-12-07 NOTE — ED Triage Notes (Signed)
Patient seen yesterday for diffuse, rash. Patient states she received two prescriptions which haven't helped and is why she has returned today. NAD, A&O x 4

## 2022-12-07 NOTE — ED Provider Notes (Signed)
[place] EMERGENCY DEPARTMENT AT [facility] Provider Note   CSN: [id number] Arrival date & time: 12/07/22  1144     History  Chief Complaint  Patient presents with   Rash    Linda Stein is a 89 y.o. female presenting for recheck today after being seen here yesterday for rash suspected to be side effect of Plaquenil which she discontinued taking last Tuesday.  She continues using a prednisone taper, currently taking 60 mg, and is also taking famotidine and Atarax which was prescribed  2 days ago by her PCP.  She returns secondary to continued spreading of this rash, now more involving her extremities, her rash on her trunk has become nearly confluent, she also reports faint rash developing on her face and neck, and is more noticeable within her scalp.  She states she was unable to sleep last night secondary to itching.  She denies nausea or vomiting, abdominal pain, shortness of breath, swelling of her face mouth or throat.  The history is provided by the patient.       Home Medications Prior to Admission medications   Medication Sig Start Date End Date Taking? Authorizing Provider  cephALEXin (KEFLEX) 500 MG capsule Take 1 capsule (500 mg total) by mouth 2 (two) times daily. 12/07/22  Yes Daijon Wenke, Raynelle Fanning, PA-C  diphenhydrAMINE (BENADRYL) 25 MG tablet Take 1 tablet (25 mg total) by mouth every 6 (six) hours. 12/07/22  Yes Shamela Haydon, Raynelle Fanning, PA-C  acetaminophen (TYLENOL) 500 MG tablet Take 1,000 mg by mouth every 6 (six) hours as needed for mild pain.    [provider]  Cholecalciferol (VITAMIN D3 PO) Take 5,000 Units by mouth in the morning.    [provider]  hydroxychloroquine (PLAQUENIL) 200 MG tablet Take 200 mg by mouth daily. 10/28/22   [provider]  pantoprazole (PROTONIX) 40 MG tablet Take 40 mg by mouth in the morning. 09/09/21   [provider]  predniSONE (STERAPRED UNI-PAK 21 TAB) 10 MG (21) TBPK tablet Take by mouth daily. Take 6  tabs by mouth daily  for 2 days, then 5 tabs for 2 days, then 4 tabs for 2 days, then 3 tabs for 2 days, 2 tabs for 2 days, then 1 tab by mouth daily for 2 days 12/06/22   Burgess Amor, PA-C  Probiotic Product [facility]) CAPS Take by mouth.    [provider]      Allergies    Sulfa antibiotics    Review of Systems   Review of Systems  Constitutional:  Negative for chills and fever.  HENT:  Negative for congestion, facial swelling, sore throat and trouble swallowing.   Eyes: Negative.   Respiratory:  Negative for cough, chest tightness, shortness of breath and wheezing.   Cardiovascular:  Negative for chest pain.  Gastrointestinal:  Negative for abdominal pain and nausea.  Genitourinary: Negative.   Musculoskeletal:  Negative for arthralgias, joint swelling and neck pain.  Skin:  Positive for rash. Negative for wound.  Neurological:  Negative for dizziness, weakness, light-headedness, numbness and headaches.  Psychiatric/Behavioral: Negative.    All other systems reviewed and are negative.   Physical Exam Updated Vital Signs BP (!) 159/74 (BP Location: Left Arm)   Pulse 84   Temp 98.1 F (36.7 C)   Resp 14   SpO2 100%  Physical Exam Vitals and nursing note reviewed.  Constitutional:      Appearance: She is well-developed.  HENT:     Head: Normocephalic  and atraumatic.  Eyes:     Conjunctiva/sclera: Conjunctivae normal.  Cardiovascular:     Rate and Rhythm: Normal rate and regular rhythm.     Heart sounds: Normal heart sounds.  Pulmonary:     Effort: Pulmonary effort is normal.     Breath sounds: Normal breath sounds. No wheezing.  Abdominal:     General: Bowel sounds are normal.     Palpations: Abdomen is soft.     Tenderness: There is no abdominal tenderness.  Musculoskeletal:        General: Normal range of motion.     Cervical back: Normal range of motion.  Skin:    General: Skin is warm and dry.     Comments: Slightly raised but macular in  appearance rash, near confluent on her back and upper chest.  More lesions on her lower abdomen and her bilateral arms and legs than ytd, but not confluent, no raised plaques suggesting urticaria.  trunk and upper extremity rash blanches, the leg sites do not blanch.  There are no pustules, papules, excoriations.   Neurological:     Mental Status: She is alert.     ED Results / Procedures / Treatments   Labs (all labs ordered are listed, but only abnormal results are displayed) Labs Reviewed  CBC - Abnormal; Notable for the following components:      Result Value   WBC 16.0 (*)    All other components within normal limits  BASIC METABOLIC PANEL - Abnormal; Notable for the following components:   BUN 24 (*)    All other components within normal limits  URINALYSIS, ROUTINE W REFLEX MICROSCOPIC - Abnormal; Notable for the following components:   Color, Urine STRAW (*)    Hgb urine dipstick SMALL (*)    Leukocytes,Ua MODERATE (*)    Bacteria, UA MANY (*)    All other components within normal limits  PROTIME-INR  SEDIMENTATION RATE    EKG None  Radiology No results found.  Procedures Procedures    Medications Ordered in ED Medications  methylPREDNISolone sodium succinate (SOLU-MEDROL) 125 mg/2 mL injection 80 mg (80 mg Intravenous Given 12/07/22 1315)  diphenhydrAMINE (BENADRYL) injection 25 mg (25 mg Intravenous Given 12/07/22 1320)  cephALEXin (KEFLEX) capsule 500 mg (500 mg Oral Given 12/07/22 1609)    ED Course/ Medical Decision Making/ A&P                             Medical Decision Making Patient with persistent and worsening allover rash blanching except on her legs which appear more petechial in nature.  Labs were repeated, additional labs including sed rate, coags and urinalysis completed today and are reassuring as she has a normal sed rate, and coags.  Her urinalysis has no proteinuria.  She was given IV Solu-Medrol and Benadryl after which she states her itching  was significantly improved, her rash is essentially unchanged from upon first arrival.  She was encouraged to continue her prednisone taper, we have switched her hydroxyzine for Benadryl and she will continue her famotidine.  She was strongly encouraged to follow-up with her rheumatologist tomorrow for a recheck of her symptoms.  She will call in the morning for this.  Return precautions were outlined.  Amount and/or Complexity of Data Reviewed Labs: ordered.    Details: Per above.  Her urine does show lots of white blood cells and bacteria, this was a clean-catch specimen.  She denies dysuria symptoms,  we will put her on a 5-day course of Keflex, urine culture was ordered.  Risk Prescription drug management.           Final Clinical Impression(s) / ED Diagnoses Final diagnoses:  Vasculitis (HCC)  Acute cystitis without hematuria    Rx / DC Orders ED Discharge Orders          Ordered    cephALEXin (KEFLEX) 500 MG capsule  2 times daily,   Status:  Discontinued        12/07/22 1543    diphenhydrAMINE (BENADRYL) 25 MG tablet  Every 6 hours        12/07/22 1543    cephALEXin (KEFLEX) 500 MG capsule  2 times daily        12/07/22 1610              Victoriano Lain 12/07/22 1825    Gerhard Munch, MD 12/10/22 1755

## 2022-12-07 NOTE — ED Notes (Signed)
ED Provider at bedside. 

## 2022-12-07 NOTE — Discharge Instructions (Signed)
Your lab work today is again reassuring with no significant abnormalities, however your urinalysis suggest that you do have a urinary tract infection as you have a lot of bacteria and white blood cells in your urine.  You are being placed on an antibiotic for this, take 1 more dose before bedtime today.  Stop taking your hydroxyzine, in place of this we would like you to take Benadryl 25 mg 4 times daily.  Continue your prednisone and your famotidine.  Plan to see your rheumatologist tomorrow for recheck of your symptoms.

## 2022-12-08 DIAGNOSIS — L308 Other specified dermatitis: Secondary | ICD-10-CM | POA: Diagnosis not present

## 2022-12-08 DIAGNOSIS — L518 Other erythema multiforme: Secondary | ICD-10-CM | POA: Diagnosis not present

## 2022-12-19 DIAGNOSIS — R319 Hematuria, unspecified: Secondary | ICD-10-CM | POA: Diagnosis not present

## 2022-12-19 DIAGNOSIS — Z6820 Body mass index (BMI) 20.0-20.9, adult: Secondary | ICD-10-CM | POA: Diagnosis not present

## 2022-12-19 DIAGNOSIS — R21 Rash and other nonspecific skin eruption: Secondary | ICD-10-CM | POA: Diagnosis not present

## 2022-12-22 DIAGNOSIS — L518 Other erythema multiforme: Secondary | ICD-10-CM | POA: Diagnosis not present

## 2022-12-24 DIAGNOSIS — N200 Calculus of kidney: Secondary | ICD-10-CM | POA: Diagnosis not present

## 2022-12-25 DIAGNOSIS — L518 Other erythema multiforme: Secondary | ICD-10-CM | POA: Diagnosis not present

## 2022-12-29 DIAGNOSIS — L518 Other erythema multiforme: Secondary | ICD-10-CM | POA: Diagnosis not present

## 2023-01-01 ENCOUNTER — Encounter: Payer: Self-pay | Admitting: Gastroenterology

## 2023-01-01 DIAGNOSIS — L518 Other erythema multiforme: Secondary | ICD-10-CM | POA: Diagnosis not present

## 2023-01-08 DIAGNOSIS — L518 Other erythema multiforme: Secondary | ICD-10-CM | POA: Diagnosis not present

## 2023-01-08 DIAGNOSIS — L820 Inflamed seborrheic keratosis: Secondary | ICD-10-CM | POA: Diagnosis not present

## 2023-01-14 DIAGNOSIS — M9905 Segmental and somatic dysfunction of pelvic region: Secondary | ICD-10-CM | POA: Diagnosis not present

## 2023-01-14 DIAGNOSIS — M9902 Segmental and somatic dysfunction of thoracic region: Secondary | ICD-10-CM | POA: Diagnosis not present

## 2023-01-14 DIAGNOSIS — M62830 Muscle spasm of back: Secondary | ICD-10-CM | POA: Diagnosis not present

## 2023-01-14 DIAGNOSIS — M9903 Segmental and somatic dysfunction of lumbar region: Secondary | ICD-10-CM | POA: Diagnosis not present

## 2023-01-15 DIAGNOSIS — N390 Urinary tract infection, site not specified: Secondary | ICD-10-CM | POA: Diagnosis not present

## 2023-01-15 DIAGNOSIS — Z6821 Body mass index (BMI) 21.0-21.9, adult: Secondary | ICD-10-CM | POA: Diagnosis not present

## 2023-01-16 DIAGNOSIS — M9905 Segmental and somatic dysfunction of pelvic region: Secondary | ICD-10-CM | POA: Diagnosis not present

## 2023-01-16 DIAGNOSIS — M9903 Segmental and somatic dysfunction of lumbar region: Secondary | ICD-10-CM | POA: Diagnosis not present

## 2023-01-16 DIAGNOSIS — M9902 Segmental and somatic dysfunction of thoracic region: Secondary | ICD-10-CM | POA: Diagnosis not present

## 2023-01-16 DIAGNOSIS — M62830 Muscle spasm of back: Secondary | ICD-10-CM | POA: Diagnosis not present

## 2023-01-19 DIAGNOSIS — M9902 Segmental and somatic dysfunction of thoracic region: Secondary | ICD-10-CM | POA: Diagnosis not present

## 2023-01-19 DIAGNOSIS — M9903 Segmental and somatic dysfunction of lumbar region: Secondary | ICD-10-CM | POA: Diagnosis not present

## 2023-01-19 DIAGNOSIS — M9905 Segmental and somatic dysfunction of pelvic region: Secondary | ICD-10-CM | POA: Diagnosis not present

## 2023-01-19 DIAGNOSIS — M62830 Muscle spasm of back: Secondary | ICD-10-CM | POA: Diagnosis not present

## 2023-01-20 DIAGNOSIS — N390 Urinary tract infection, site not specified: Secondary | ICD-10-CM | POA: Diagnosis not present

## 2023-01-20 DIAGNOSIS — R21 Rash and other nonspecific skin eruption: Secondary | ICD-10-CM | POA: Diagnosis not present

## 2023-01-20 DIAGNOSIS — R609 Edema, unspecified: Secondary | ICD-10-CM | POA: Diagnosis not present

## 2023-01-20 DIAGNOSIS — Z6821 Body mass index (BMI) 21.0-21.9, adult: Secondary | ICD-10-CM | POA: Diagnosis not present

## 2023-01-22 DIAGNOSIS — M9903 Segmental and somatic dysfunction of lumbar region: Secondary | ICD-10-CM | POA: Diagnosis not present

## 2023-01-22 DIAGNOSIS — M62830 Muscle spasm of back: Secondary | ICD-10-CM | POA: Diagnosis not present

## 2023-01-22 DIAGNOSIS — M9902 Segmental and somatic dysfunction of thoracic region: Secondary | ICD-10-CM | POA: Diagnosis not present

## 2023-01-22 DIAGNOSIS — M9905 Segmental and somatic dysfunction of pelvic region: Secondary | ICD-10-CM | POA: Diagnosis not present

## 2023-01-23 ENCOUNTER — Encounter (HOSPITAL_COMMUNITY): Payer: Self-pay | Admitting: *Deleted

## 2023-01-23 ENCOUNTER — Emergency Department (HOSPITAL_COMMUNITY)
Admission: EM | Admit: 2023-01-23 | Discharge: 2023-01-23 | Disposition: A | Discharge status: Home / Self Care | Payer: Medicare Other | Attending: Emergency Medicine | Admitting: Emergency Medicine

## 2023-01-23 ENCOUNTER — Other Ambulatory Visit: Payer: Self-pay

## 2023-01-23 DIAGNOSIS — R8271 Bacteriuria: Secondary | ICD-10-CM | POA: Insufficient documentation

## 2023-01-23 NOTE — Discharge Instructions (Signed)
I reviewed your case with the infectious disease doctor on call, Dr Elinor Parkinson.  You do not need antibiotic treatment of your abnormal urine culture.  This would be classified as asymptomatic bacteria.  She started developing fevers chills urinary frequency, burning with urination or other urinary symptoms that would change and antibiotic treatment would be recommended at that time.

## 2023-01-23 NOTE — ED Provider Notes (Signed)
[place] EMERGENCY DEPARTMENT AT [facility] Provider Note   CSN: [id number] Arrival date & time: 01/23/23  1035     History  Chief Complaint  Patient presents with   Abnormal Lab    Linda Stein is a 89 y.o. female.   Abnormal Lab    Patient presents to the ED for evaluation of an abnormal urine culture.  Patient was initially seen in the emergency room the end of April.  Patient had a diffuse rash.  She was ultimately diagnosed with vasculitis after being evaluated in the ED.  Patient was started on medications and recommend to follow-up with her rheumatologist.  As part of her evaluation she had a urinalysis.  It did show moderate leukocyte Estrace and many bacteria.  She also had a leukocytosis.  Patient was started on antibiotics.  Patient denied any urinary discomfort at that time.  She was not having any abdominal pain.  Patient states she has followed up with her doctor in [place] since that time.  She had a urine culture that was positive for E. coli that is multidrug-resistant.  Most likely ESBL.  Patient states her doctor sent her to the ED with a note stating that she needs IV antibiotics and to be seen by infectious disease.  Patient states she is still not having any urinary symptoms whatsoever.  She is not having urinary frequency.  She is not having any fevers or chills.  She does not have any abdominal pain.  Home Medications Prior to Admission medications   Medication Sig Start Date End Date Taking? Authorizing Provider  acetaminophen (TYLENOL) 500 MG tablet Take 1,000 mg by mouth every 6 (six) hours as needed for mild pain.    [provider]  cephALEXin (KEFLEX) 500 MG capsule Take 1 capsule (500 mg total) by mouth 2 (two) times daily. 12/07/22   Burgess Amor, PA-C  Cholecalciferol (VITAMIN D3 PO) Take 5,000 Units by mouth in the morning.    [provider]  diphenhydrAMINE (BENADRYL) 25 MG tablet Take 1 tablet (25 mg total) by mouth every  6 (six) hours. 12/07/22   Burgess Amor, PA-C  hydroxychloroquine (PLAQUENIL) 200 MG tablet Take 200 mg by mouth daily. 10/28/22   [provider]  pantoprazole (PROTONIX) 40 MG tablet Take 40 mg by mouth in the morning. 09/09/21   [provider]  predniSONE (STERAPRED UNI-PAK 21 TAB) 10 MG (21) TBPK tablet Take by mouth daily. Take 6 tabs by mouth daily  for 2 days, then 5 tabs for 2 days, then 4 tabs for 2 days, then 3 tabs for 2 days, 2 tabs for 2 days, then 1 tab by mouth daily for 2 days 12/06/22   Burgess Amor, PA-C  Probiotic Product [facility]) CAPS Take by mouth.    [provider]      Allergies    Plaquenil [hydroxychloroquine] and Sulfa antibiotics    Review of Systems   Review of Systems  Physical Exam Updated Vital Signs BP (!) 162/65 (BP Location: Right Arm)   Pulse 96   Temp 98.2 F (36.8 C) (Oral)   Resp 15   Ht 1.549 m (5\' 1" )   Wt 52.6 kg   SpO2 99%   BMI 21.92 kg/m  Physical Exam Vitals and nursing note reviewed.  Constitutional:      General: She is not in acute distress.    Appearance: She is well-developed. She is not ill-appearing or diaphoretic.  HENT:  Head: Normocephalic and atraumatic.     Right Ear: External ear normal.     Left Ear: External ear normal.  Eyes:     General: No scleral icterus.       Right eye: No discharge.        Left eye: No discharge.     Conjunctiva/sclera: Conjunctivae normal.  Neck:     Trachea: No tracheal deviation.  Cardiovascular:     Rate and Rhythm: Normal rate.  Pulmonary:     Effort: Pulmonary effort is normal. No respiratory distress.     Breath sounds: No stridor.  Abdominal:     General: There is no distension.     Palpations: There is no mass.     Tenderness: There is no abdominal tenderness.  Musculoskeletal:        General: No swelling or deformity.     Cervical back: Neck supple.  Skin:    General: Skin is warm and dry.     Findings: No rash.  Neurological:      Mental Status: She is alert. Mental status is at baseline.     Cranial Nerves: No dysarthria or facial asymmetry.     Motor: No seizure activity.     ED Results / Procedures / Treatments   Labs (all labs ordered are listed, but only abnormal results are displayed) Labs Reviewed - No data to display  EKG None  Radiology No results found.  Procedures Procedures    Medications Ordered in ED Medications - No data to display  ED Course/ Medical Decision Making/ A&P                             Medical Decision Making Patient presents to the ED with a symptom bacteriuria.  She does have an abnormal urine culture that was obtained on June 6.  Patient is not having any urinary symptoms .  She denies urinary frequency urgency dysuria.  She is afebrile with normal vital signs here.  I will consult with infectious disease as I am not sure she requires any treatment of this  Reviewed the case with Dr. Elinor Parkinson, infectious disease.  With the patient not having any urinary symptoms she would not recommend treatment for asymptomatic bacteriuria.  Patient has not had any recent urologic procedures.  I reviewed this with the patient.  She does not require abx treatment of her abnormal urine sample          Final Clinical Impression(s) / ED Diagnoses Final diagnoses:  Asymptomatic bacteriuria    Rx / DC Orders ED Discharge Orders     None         Linwood Dibbles, MD 01/23/23 1141

## 2023-01-23 NOTE — ED Triage Notes (Signed)
Pt sent to ED due to positive urine culture that is resistant to multiple antibiotics.

## 2023-01-30 DIAGNOSIS — R8271 Bacteriuria: Secondary | ICD-10-CM | POA: Diagnosis not present

## 2023-01-30 DIAGNOSIS — Z6821 Body mass index (BMI) 21.0-21.9, adult: Secondary | ICD-10-CM | POA: Diagnosis not present

## 2023-01-30 DIAGNOSIS — R609 Edema, unspecified: Secondary | ICD-10-CM | POA: Diagnosis not present

## 2023-01-30 DIAGNOSIS — T148XXA Other injury of unspecified body region, initial encounter: Secondary | ICD-10-CM | POA: Diagnosis not present

## 2023-02-02 DIAGNOSIS — R8271 Bacteriuria: Secondary | ICD-10-CM | POA: Diagnosis not present

## 2023-02-02 DIAGNOSIS — T148XXA Other injury of unspecified body region, initial encounter: Secondary | ICD-10-CM | POA: Diagnosis not present

## 2023-02-03 ENCOUNTER — Other Ambulatory Visit: Payer: Self-pay | Admitting: *Deleted

## 2023-02-03 DIAGNOSIS — T148XXA Other injury of unspecified body region, initial encounter: Secondary | ICD-10-CM | POA: Diagnosis not present

## 2023-02-03 DIAGNOSIS — R8271 Bacteriuria: Secondary | ICD-10-CM | POA: Diagnosis not present

## 2023-02-03 DIAGNOSIS — Z6821 Body mass index (BMI) 21.0-21.9, adult: Secondary | ICD-10-CM | POA: Diagnosis not present

## 2023-02-03 DIAGNOSIS — M7989 Other specified soft tissue disorders: Secondary | ICD-10-CM

## 2023-02-03 DIAGNOSIS — R609 Edema, unspecified: Secondary | ICD-10-CM | POA: Diagnosis not present

## 2023-02-04 DIAGNOSIS — M62830 Muscle spasm of back: Secondary | ICD-10-CM | POA: Diagnosis not present

## 2023-02-04 DIAGNOSIS — M9903 Segmental and somatic dysfunction of lumbar region: Secondary | ICD-10-CM | POA: Diagnosis not present

## 2023-02-04 DIAGNOSIS — M9905 Segmental and somatic dysfunction of pelvic region: Secondary | ICD-10-CM | POA: Diagnosis not present

## 2023-02-04 DIAGNOSIS — M9902 Segmental and somatic dysfunction of thoracic region: Secondary | ICD-10-CM | POA: Diagnosis not present

## 2023-02-06 DIAGNOSIS — M9905 Segmental and somatic dysfunction of pelvic region: Secondary | ICD-10-CM | POA: Diagnosis not present

## 2023-02-06 DIAGNOSIS — M9902 Segmental and somatic dysfunction of thoracic region: Secondary | ICD-10-CM | POA: Diagnosis not present

## 2023-02-06 DIAGNOSIS — M9903 Segmental and somatic dysfunction of lumbar region: Secondary | ICD-10-CM | POA: Diagnosis not present

## 2023-02-06 DIAGNOSIS — M62830 Muscle spasm of back: Secondary | ICD-10-CM | POA: Diagnosis not present

## 2023-02-11 ENCOUNTER — Ambulatory Visit: Payer: Medicare Other | Admitting: Physician Assistant

## 2023-02-11 ENCOUNTER — Ambulatory Visit (HOSPITAL_COMMUNITY)
Admission: RE | Admit: 2023-02-11 | Discharge: 2023-02-11 | Disposition: A | Discharge status: Home / Self Care | Payer: Medicare Other | Source: Ambulatory Visit | Attending: Vascular Surgery | Admitting: Vascular Surgery

## 2023-02-11 VITALS — BP 144/78 | HR 81 | Temp 98.6°F | Wt 119.0 lb

## 2023-02-11 DIAGNOSIS — I890 Lymphedema, not elsewhere classified: Secondary | ICD-10-CM

## 2023-02-11 DIAGNOSIS — M7989 Other specified soft tissue disorders: Secondary | ICD-10-CM | POA: Diagnosis not present

## 2023-02-11 DIAGNOSIS — R2243 Localized swelling, mass and lump, lower limb, bilateral: Secondary | ICD-10-CM

## 2023-02-11 NOTE — Progress Notes (Signed)
Requested by:  Donetta Potts, MD [address] [place],  [place] [address]  Reason for consultation: bilateral lower extremity edema    History of Present Illness   Linda Stein is a 89 y.o. (02-08-1934) female who presents for evaluation of bilateral lower extremity swelling. She states about 2-3 months ago she randomly started having bilateral and equal lower extremity swelling extending from her mid-tibia to her toes. This happened around the same time she developed a full body rash from Plaquenil. Her swelling "comes and goes" and does not seem to worse or better a specific time in the day. She elevates her feet but not above her heart. She is unsure if leg elevation helps. She has previously been prescribed knee high compression stockings but was not able to wear these for several weeks because of her rash.   She denies any prior history of bleeding, ulceration, DVT, or vein procedures.  Past Medical History:  Diagnosis Date   Atrophic vaginitis    Diarrhea    GERD (gastroesophageal reflux disease)    History of kidney stones    Irritable bowel syndrome     Past Surgical History:  Procedure Laterality Date   BIOPSY  10/16/2021   Procedure: BIOPSY;  Surgeon: Lanelle Bal, DO;  Location: [facility];  Service: Endoscopy;;   Cataract surgery  2021   CESAREAN SECTION  1964   ESOPHAGOGASTRODUODENOSCOPY (EGD) WITH PROPOFOL N/A 10/16/2021   Procedure: ESOPHAGOGASTRODUODENOSCOPY (EGD) WITH PROPOFOL;  Surgeon: Lanelle Bal, DO;  Location: [facility];  Service: Endoscopy;  Laterality: N/A;  11:30am   NEPHROLITHOTOMY Right 03/20/2021   Procedure: FIRST STAGE RIGHT NEPHROLITHOTOMY PERCUTANEOUS WITH  SURGEON ACCESS CYSTOSCOPY RIGHT RETROGRADE;  Surgeon: Sebastian Ache, MD;  Location: [facility];  Service: Urology;  Laterality: Right;   NEPHROLITHOTOMY Right 03/22/2021   Procedure: NEPHROLITHOTOMY PERCUTANEOUS SECOND LOOK, URETERAL STENT PLACEMENT,LEFT;  Surgeon: Sebastian Ache, MD;  Location: [facility];  Service: Urology;  Laterality: Right;    Social History   Socioeconomic History   Marital status: Widowed    Spouse name: Not on file   Number of children: Not on file   Years of education: Not on file   Highest education level: Not on file  Occupational History   Not on file  Tobacco Use   Smoking status: Former    Types: Cigarettes   Smokeless tobacco: Never  Vaping Use   Vaping Use: Never used  Substance and Sexual Activity   Alcohol use: No   Drug use: No   Sexual activity: Not Currently    Birth control/protection: Post-menopausal  Other Topics Concern   Not on file  Social History Narrative   Not on file   Social Determinants of Health   Financial Resource Strain: Not on file  Food Insecurity: Not on file  Transportation Needs: Not on file  Physical Activity: Not on file  Stress: Not on file  Social Connections: Not on file  Intimate Partner Violence: Not on file    Family History  Problem Relation Age of Onset   Other Mother        aneursym   Stroke Mother    Cancer Father    Other Son        hip replacement   Colon cancer Neg Hx     Current Outpatient Medications  Medication Sig Dispense Refill   acetaminophen (TYLENOL) 500 MG tablet Take 1,000 mg by mouth every 6 (six) hours as needed  for mild pain.     Cholecalciferol (VITAMIN D3 PO) Take 5,000 Units by mouth in the morning.     pantoprazole (PROTONIX) 40 MG tablet Take 40 mg by mouth in the morning.     predniSONE (STERAPRED UNI-PAK 21 TAB) 10 MG (21) TBPK tablet Take by mouth daily. Take 6 tabs by mouth daily  for 2 days, then 5 tabs for 2 days, then 4 tabs for 2 days, then 3 tabs for 2 days, 2 tabs for 2 days, then 1 tab by mouth daily for 2 days 42 tablet 0   Probiotic Product ([facility]) CAPS Take by mouth.     No current facility-administered medications for this visit.    Allergies  Allergen Reactions   Plaquenil [Hydroxychloroquine] Rash    Sulfa Antibiotics Other (See Comments)    Sores in mouth.    REVIEW OF SYSTEMS (negative unless checked):   Cardiac:  []  Chest pain or chest pressure? []  Shortness of breath upon activity? []  Shortness of breath when lying flat? []  Irregular heart rhythm?  Vascular:  []  Pain in calf, thigh, or hip brought on by walking? []  Pain in feet at night that wakes you up from your sleep? []  Blood clot in your veins? [x]  Leg swelling?  Pulmonary:  []  Oxygen at home? []  Productive cough? []  Wheezing?  Neurologic:  []  Sudden weakness in arms or legs? []  Sudden numbness in arms or legs? []  Sudden onset of difficult speaking or slurred speech? []  Temporary loss of vision in one eye? []  Problems with dizziness?  Gastrointestinal:  []  Blood in stool? []  Vomited blood?  Genitourinary:  []  Burning when urinating? []  Blood in urine?  Psychiatric:  []  Major depression  Hematologic:  []  Bleeding problems? []  Problems with blood clotting?  Dermatologic:  []  Rashes or ulcers?  Constitutional:  []  Fever or chills?  Ear/Nose/Throat:  []  Change in hearing? []  Nose bleeds? []  Sore throat?  Musculoskeletal:  []  Back pain? []  Joint pain? []  Muscle pain?   Physical Examination     Vitals:   02/11/23 1315  BP: (!) 144/78  Pulse: 81  Temp: 98.6 F (37 C)  TempSrc: Temporal  SpO2: 96%  Weight: 119 lb (54 kg)   Body mass index is 22.48 kg/m.  General:  WDWN in NAD; vital signs documented above Gait: Not observed HENT: WNL, normocephalic Pulmonary: normal non-labored breathing , without Rales, rhonchi,  wheezing Cardiac: regular Abdomen: soft, NT, no masses Skin: without rashes Vascular Exam/Pulses: palpable pedal pulses Extremities: stasis pigmentation of bilateral shins, 1+ pitting edema of bilateral distal shins, ankles, and feet, scattered spider veins. No ulcerations Musculoskeletal: no muscle wasting or atrophy  Neurologic: A&O X 3;  No focal weakness or  paresthesias are detected Psychiatric:  The pt has Normal affect.  Non-invasive Vascular Imaging   LLE Venous Insufficiency Duplex (02/11/2023):  LEFT          Reflux NoRefluxReflux TimeDiameter cmsComments                          Yes                                   +--------------+---------+------+-----------+------------+--------+  CFV          no                                              +--------------+---------+------+-----------+------------+--------+  FV prox       no                                              +--------------+---------+------+-----------+------------+--------+  FV mid        no                                              +--------------+---------+------+-----------+------------+--------+  FV dist       no                                              +--------------+---------+------+-----------+------------+--------+  Popliteal    no                                              +--------------+---------+------+-----------+------------+--------+  GSV at SFJ              yes    >500 ms     0.721              +--------------+---------+------+-----------+------------+--------+  GSV prox thighno                           0.301              +--------------+---------+------+-----------+------------+--------+  GSV mid thigh no                           0.288              +--------------+---------+------+-----------+------------+--------+  GSV dist thighno                            0.31              +--------------+---------+------+-----------+------------+--------+  GSV at knee   no                            0274              +--------------+---------+------+-----------+------------+--------+  GSV prox calf no                           0.242    484 ms    +--------------+---------+------+-----------+------------+--------+  SSV Pop Fossa no                            0.201              +--------------+---------+------+-----------+------------+--------+  SSV prox calf no                           0.201              +--------------+---------+------+-----------+------------+--------+  SSV mid calf  no  0.178              +--------------+---------+------+-----------+------------+--------+    Medical Decision Making   SAMARIE HANTMAN is a 89 y.o. female who presents with bilateral lower extremity edema  Based on the patient's reflux study, there is reflux in the left GSV at the junction. The rest of the superficial and deep venous system is competent. There is no evidence of DVT or SVT. She would not be a candidate for GSV ablation and likely would not benefit from it. She describes a 2-3 month history of bilateral lower extremity swelling extending from the mid-shin to the toes. Given that her swelling extends to her feet and toes, this could be caused by lymphedema. Her swelling has been stable since its onset. She has 1+ pitting edema on exam with no ulcerations. I explained to the patient I am unsure if her swelling is caused by her underlying rheumatologic diseases or by her recent issues with Plaquenil. Either way I believe her swelling can be adequately controlled with compression stockings, leg elevation above her heart, exercise, and avoiding prolonged sitting or standing. She already has knee high compression stockings she can wear She can follow up with our office as needed  Ernestene Mention, PA-C [facility] Office: [phone]  02/11/2023, 1:14 PM  Clinic MD: Randie Heinz

## 2023-02-13 DIAGNOSIS — M9902 Segmental and somatic dysfunction of thoracic region: Secondary | ICD-10-CM | POA: Diagnosis not present

## 2023-02-13 DIAGNOSIS — M62830 Muscle spasm of back: Secondary | ICD-10-CM | POA: Diagnosis not present

## 2023-02-13 DIAGNOSIS — M9905 Segmental and somatic dysfunction of pelvic region: Secondary | ICD-10-CM | POA: Diagnosis not present

## 2023-02-13 DIAGNOSIS — M9903 Segmental and somatic dysfunction of lumbar region: Secondary | ICD-10-CM | POA: Diagnosis not present

## 2023-02-17 DIAGNOSIS — H524 Presbyopia: Secondary | ICD-10-CM | POA: Diagnosis not present

## 2023-02-17 DIAGNOSIS — H40013 Open angle with borderline findings, low risk, bilateral: Secondary | ICD-10-CM | POA: Diagnosis not present

## 2023-02-19 ENCOUNTER — Ambulatory Visit: Payer: Medicare Other | Admitting: Infectious Diseases

## 2023-02-19 ENCOUNTER — Other Ambulatory Visit: Payer: Self-pay

## 2023-02-19 ENCOUNTER — Encounter: Payer: Self-pay | Admitting: Infectious Diseases

## 2023-02-19 VITALS — BP 147/76 | HR 84 | Temp 97.4°F | Ht 60.0 in | Wt 116.0 lb

## 2023-02-19 DIAGNOSIS — Z1612 Extended spectrum beta lactamase (ESBL) resistance: Secondary | ICD-10-CM | POA: Diagnosis not present

## 2023-02-19 DIAGNOSIS — A499 Bacterial infection, unspecified: Secondary | ICD-10-CM | POA: Diagnosis not present

## 2023-02-19 DIAGNOSIS — R8271 Bacteriuria: Secondary | ICD-10-CM

## 2023-02-19 NOTE — Progress Notes (Addendum)
Patient Active Problem List   Diagnosis Date Noted   Change in bowel habits    Staghorn calculus 03/20/2021   Urinary tract infection, site not specified 02/10/2021   Pelvic pressure in female 11/03/2018   Vaginal atrophy 11/03/2018   Globus pharyngeus 10/16/2017   Gastroesophageal reflux disease without esophagitis 10/16/2017   Cough, persistent 10/16/2017   Vaginal irritation 06/07/2014   Atrophic vaginitis 06/07/2014    Patient's Medications  New Prescriptions   No medications on file  Previous Medications   ACETAMINOPHEN (TYLENOL) 500 MG TABLET    Take 1,000 mg by mouth every 6 (six) hours as needed for mild pain.   CHOLECALCIFEROL (VITAMIN D3 PO)    Take 5,000 Units by mouth in the morning.   PANTOPRAZOLE (PROTONIX) 40 MG TABLET    Take 40 mg by mouth in the morning.   PREDNISONE (STERAPRED UNI-PAK 21 TAB) 10 MG (21) TBPK TABLET    Take by mouth daily. Take 6 tabs by mouth daily  for 2 days, then 5 tabs for 2 days, then 4 tabs for 2 days, then 3 tabs for 2 days, 2 tabs for 2 days, then 1 tab by mouth daily for 2 days  Modified Medications   No medications on file  Discontinued Medications   No medications on file    Subjective: 89 year old female with prior history of GERD, atrophic vaginitis, kidney stone/staghorn calculus was referred from PCP for concern for UTI,  Per outside records, urine cx 6/6 probable ESBL greater than 100,000 CFU/ml ( R to bactrim, ciprofloxacin, nitrofurantoin).  Patient is accompanied by RN who states that [place] and she has accompanied to figure out the plan in terms of her " UTI as  she never had any urinary symptoms.   She had an allergic reaction presumed secondary to Plaquenil when seen in ED on April 27 and 28, 2024.  Plaquenil was stopped by her rheumatologist and was started on steroids.  UA on 4/28 was with moderate bacteria and leukocytes but no nitrates.  She reports she did not have any GU symptoms at that time. She  followed her family doctor who did a UA when she did not have symptoms and was told to have drug-resistant E. coli.  She was sent to [facility] ED on 6/14 for treatment of resistant E. coli UTI by her family physician at which time patient did not have any symptoms.  Case was discussed by ED physician with myself and was discharged without antibiotics as thought to be asymptomatic bacteriuria.  She was admitted in August 2022 for right partial staghorn stone and underwent cystoscopy with right retrograde pyelogram, insertion of right ureteral stent, right percutaneous nephrosto lithotomy stone greater than 2 cm, right nephrostomy tube and right anterograde nephrostogram ( 03/20/21) followed by second look right percutaneous nephrostolithotomy, right ureteral stent exchange, right diagnostic ureteroscopy and right nephrostogram. She reports she follows up with urology every 2 years.  Think she was last treated for UTI in February/March 2024.  She was also seen in the ED on May 2022 for acute cystitis and was prescribed cephalexin.   She lives in an apartment.  Denies smoking, alcohol and IVDU Rashes have resolved   Review of Systems: all systems reviewed with pertinent positives and negatives as listed above  Past Medical History:  Diagnosis Date   Atrophic vaginitis    Diarrhea    GERD (gastroesophageal reflux disease)    History of kidney  stones    Irritable bowel syndrome    Past Surgical History:  Procedure Laterality Date   BIOPSY  10/16/2021   Procedure: BIOPSY;  Surgeon: Lanelle Bal, DO;  [facility];  Service: Endoscopy;;   Cataract surgery  2021   CESAREAN SECTION  1962   ESOPHAGOGASTRODUODENOSCOPY (EGD) WITH PROPOFOL N/A 10/16/2021   Procedure: ESOPHAGOGASTRODUODENOSCOPY (EGD) WITH PROPOFOL;  Surgeon: Lanelle Bal, DO;  [facility];  Service: Endoscopy;  Laterality: N/A;  11:30am   NEPHROLITHOTOMY Right 03/20/2021   Procedure: FIRST STAGE RIGHT NEPHROLITHOTOMY  PERCUTANEOUS WITH  SURGEON ACCESS CYSTOSCOPY RIGHT RETROGRADE;  Surgeon: Sebastian Ache, MD;  Location: [facility];  Service: Urology;  Laterality: Right;   NEPHROLITHOTOMY Right 03/22/2021   Procedure: NEPHROLITHOTOMY PERCUTANEOUS SECOND LOOK, URETERAL STENT PLACEMENT,LEFT;  Surgeon: Sebastian Ache, MD;  Location: [facility];  Service: Urology;  Laterality: Right;     Social History   Tobacco Use   Smoking status: Former    Types: Cigarettes   Smokeless tobacco: Never  Vaping Use   Vaping status: Never Used  Substance Use Topics   Alcohol use: No   Drug use: No    Family History  Problem Relation Age of Onset   Other Mother        aneursym   Stroke Mother    Cancer Father    Other Son        hip replacement   Colon cancer Neg Hx     Allergies  Allergen Reactions   Plaquenil [Hydroxychloroquine] Rash   Sulfa Antibiotics Other (See Comments)    Sores in mouth.    Health Maintenance  Topic Date Due   Medicare Annual Wellness (AWV)  Never done   COVID-19 Vaccine (1) Never done   DTaP/Tdap/Td (1 - Tdap) Never done   Zoster Vaccines- Shingrix (1 of 2) Never done   Pneumonia Vaccine 65+ Years old (1 of 1 - PCV) Never done   INFLUENZA VACCINE  03/12/2023   DEXA SCAN  Completed   HPV VACCINES  Aged Out    Objective: BP (!) 147/76   Pulse 84   Temp (!) 97.4 F (36.3 C) (Oral)   Ht 5' (1.524 m)   Wt 116 lb (52.6 kg)   BMI 22.65 kg/m    Physical Exam Constitutional:      Appearance: Normal appearance.  HENT:     Head: Normocephalic and atraumatic.      Mouth: Mucous membranes are moist.  Eyes:    Conjunctiva/sclera: Conjunctivae normal.     Pupils: Pupils are equal, round, and bilaterally symmetrical  Cardiovascular:     Rate and Rhythm: Normal rate and regular rhythm.     Heart sounds: No murmur heard. No friction rub. No gallop.   Pulmonary:     Effort: Pulmonary effort is normal.     Breath sounds: Normal breath sounds.   Abdominal:     General: Non  distended     Palpations: soft.   Musculoskeletal:        General: Normal range of motion.   Skin:    General: Skin is warm and dry.     Comments:  Neurological:     General: grossly non focal     Mental Status: awake, alert and oriented to person, place, and time.   Psychiatric:        Mood and Affect: Mood normal.   Lab Results Lab Results  Component Value Date   WBC 16.0 (H) 12/07/2022  HGB 13.1 12/07/2022   HCT 40.2 12/07/2022   MCV 90.7 12/07/2022   PLT 194 12/07/2022    Lab Results  Component Value Date   CREATININE 0.83 12/07/2022   BUN 24 (H) 12/07/2022   NA 137 12/07/2022   K 3.9 12/07/2022   CL 104 12/07/2022   CO2 25 12/07/2022   No results found for: "ALT", "AST", "GGT", "ALKPHOS", "BILITOT"  No results found for: "CHOL", "HDL", "LDLCALC", "LDLDIRECT", "TRIG", "CHOLHDL" No results found for: "LABRPR", "RPRTITER" No results found for: "HIV1RNAQUANT", "HIV1RNAVL", "CD4TABS"   Microbiology Results for orders placed or performed in visit on 12/25/21  Clostridium Difficile by PCR     Status: None   Collection Time: 12/30/21  2:43 PM   Specimen: STOOL   ST  Result Value Ref Range Status   Toxigenic C. Difficile by PCR Negative Negative Final    Assessment/plan 89 year old female with prior history of GERD, atrophic vaginitis, kidney stone/staghorn calculus with   # asymptomatic bacteriuria  - no symptoms whatsoever - discussed signs and symptoms or true UTI and indications for antibiotics in asymptomatic bacteriuria - Discussed preventive measures for UTI like adequate water intake, vaginal estrogen, cranberry juice etc - Fu with [place] as needed   # Kidney stones  - fu with Urology as instructed   I have personally spent 60 minutes involved in face-to-face and non-face-to-face activities for this patient on the day of the visit. Professional time spent includes the following activities: Preparing to see the patient (review of tests), Obtaining and/or  reviewing separately obtained history (admission/discharge record), Performing a medically appropriate examination and/or evaluation , Ordering medications/tests/procedures, referring and communicating with other health care professionals, Documenting clinical information in the EMR, Independently interpreting results (not separately reported), Communicating results to the patient/family/caregiver, Counseling and educating the patient/family/caregiver and Care coordination (not separately reported).   Victoriano Lain, MD [facility] 02/19/2023, 8:12 AM

## 2023-02-20 DIAGNOSIS — M9902 Segmental and somatic dysfunction of thoracic region: Secondary | ICD-10-CM | POA: Diagnosis not present

## 2023-02-20 DIAGNOSIS — M9903 Segmental and somatic dysfunction of lumbar region: Secondary | ICD-10-CM | POA: Diagnosis not present

## 2023-02-20 DIAGNOSIS — M9905 Segmental and somatic dysfunction of pelvic region: Secondary | ICD-10-CM | POA: Diagnosis not present

## 2023-02-20 DIAGNOSIS — M62830 Muscle spasm of back: Secondary | ICD-10-CM | POA: Diagnosis not present

## 2023-02-22 DIAGNOSIS — R8271 Bacteriuria: Secondary | ICD-10-CM | POA: Insufficient documentation

## 2023-02-22 DIAGNOSIS — Z1612 Extended spectrum beta lactamase (ESBL) resistance: Secondary | ICD-10-CM | POA: Insufficient documentation

## 2023-02-23 DIAGNOSIS — Z0000 Encounter for general adult medical examination without abnormal findings: Secondary | ICD-10-CM | POA: Diagnosis not present

## 2023-02-23 DIAGNOSIS — Z131 Encounter for screening for diabetes mellitus: Secondary | ICD-10-CM | POA: Diagnosis not present

## 2023-02-23 DIAGNOSIS — E039 Hypothyroidism, unspecified: Secondary | ICD-10-CM | POA: Diagnosis not present

## 2023-02-23 DIAGNOSIS — E559 Vitamin D deficiency, unspecified: Secondary | ICD-10-CM | POA: Diagnosis not present

## 2023-02-24 DIAGNOSIS — K080 Exfoliation of teeth due to systemic causes: Secondary | ICD-10-CM | POA: Diagnosis not present

## 2023-02-26 DIAGNOSIS — M1990 Unspecified osteoarthritis, unspecified site: Secondary | ICD-10-CM | POA: Diagnosis not present

## 2023-02-26 DIAGNOSIS — K219 Gastro-esophageal reflux disease without esophagitis: Secondary | ICD-10-CM | POA: Diagnosis not present

## 2023-02-26 DIAGNOSIS — Z0001 Encounter for general adult medical examination with abnormal findings: Secondary | ICD-10-CM | POA: Diagnosis not present

## 2023-02-26 DIAGNOSIS — Z6821 Body mass index (BMI) 21.0-21.9, adult: Secondary | ICD-10-CM | POA: Diagnosis not present

## 2023-02-26 DIAGNOSIS — H524 Presbyopia: Secondary | ICD-10-CM | POA: Diagnosis not present

## 2023-02-26 DIAGNOSIS — M810 Age-related osteoporosis without current pathological fracture: Secondary | ICD-10-CM | POA: Diagnosis not present

## 2023-03-09 DIAGNOSIS — M353 Polymyalgia rheumatica: Secondary | ICD-10-CM | POA: Diagnosis not present

## 2023-03-09 DIAGNOSIS — M064 Inflammatory polyarthropathy: Secondary | ICD-10-CM | POA: Diagnosis not present

## 2023-03-09 DIAGNOSIS — Z7952 Long term (current) use of systemic steroids: Secondary | ICD-10-CM | POA: Diagnosis not present

## 2023-03-09 DIAGNOSIS — M1991 Primary osteoarthritis, unspecified site: Secondary | ICD-10-CM | POA: Diagnosis not present

## 2023-04-11 DIAGNOSIS — K921 Melena: Secondary | ICD-10-CM | POA: Diagnosis not present

## 2023-04-11 DIAGNOSIS — Z6821 Body mass index (BMI) 21.0-21.9, adult: Secondary | ICD-10-CM | POA: Diagnosis not present

## 2023-04-11 DIAGNOSIS — Z1212 Encounter for screening for malignant neoplasm of rectum: Secondary | ICD-10-CM | POA: Diagnosis not present

## 2023-04-11 DIAGNOSIS — R197 Diarrhea, unspecified: Secondary | ICD-10-CM | POA: Diagnosis not present

## 2023-04-13 NOTE — Progress Notes (Signed)
GI Office Note    Referring Provider: Donetta Potts, MD Primary Care Physician:  Donetta Potts, MD  Primary Gastroenterologist: Hennie Duos. Marletta Lor, DO   Chief Complaint   Chief Complaint  Patient presents with   Diarrhea    Pt has been having an issue with diarrhea since Last Thursday and states that it was black in color. Pt has brought in a sanitary napkin with stool on it.     History of Present Illness   Linda Stein is a 89 y.o. female presenting today for follow up. Last seen 03/2022. 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  Patient presents with recent onset diarrhea. Thursday filled commode with black loose stools. She spoke to her friend who is a Charity fundraiser, due to the black stools she went to see her PCP Saturday morning. States she completed ifobt but she is not sure how good of a specimen it was because she sampled it off her sanitary napkin she was wearing. She had blood work done but apparently it was not processed appropriately and she had to have it redone and results are pending.   Stool frequency not bad but stools are very loose. Typically mostly in the mornings. She did take Kaopectate Thursday/Friday and Pepto, Sunday, Monday, and Tuesday which is likely the source of her black stools.  Prior to recent onset of diarrhea, she had been doing well having regular stools, normal in color.   No ill contacts. Last antibiotics about 2-3 months ago. No abdominal pain. Complains of feels uncomfortable and bloated. No N/V. No weakness/lightheadedness. Appetite normal.   She states due to her history of kidney stones, she has been taking citric acid added to her water daily for past one year.   EGD March 2023: -Z-line regular, 36 cm from the incisors. - Gastritis. Biopsied. No h.pylori - Normal duodenal bulb, first portion of the duodenum and second portion of the duodenum.  Medications   Current Outpatient Medications  Medication Sig Dispense Refill    Cholecalciferol (VITAMIN D3 PO) Take 5,000 Units by mouth in the morning.     pantoprazole (PROTONIX) 40 MG tablet Take 40 mg by mouth in the morning.     predniSONE (DELTASONE) 1 MG tablet Take 4 mg by mouth daily with breakfast.     No current facility-administered medications for this visit.    Allergies   Allergies as of 04/14/2023 - Review Complete 04/14/2023  Allergen Reaction Noted   Plaquenil [hydroxychloroquine] Rash 01/23/2023   Sulfa antibiotics Other (See Comments) 06/07/2014      Review of Systems   General: Negative for anorexia, weight loss, fever, chills, fatigue, weakness. ENT: Negative for hoarseness, difficulty swallowing , nasal congestion. CV: Negative for chest pain, angina, palpitations, dyspnea on exertion, peripheral edema.  Respiratory: Negative for dyspnea at rest, dyspnea on exertion, cough, sputum, wheezing.  GI: See history of present illness. GU:  Negative for dysuria, hematuria, urinary incontinence, urinary frequency, nocturnal urination.  Endo: Negative for unusual weight change.     Physical Exam   BP 134/65 (BP Location: Right Arm, Patient Position: Sitting, Cuff Size: Normal)   Pulse 79   Temp 98 F (36.7 C) (Oral)   Ht 5\' 1"  (1.549 m)   Wt 120 lb (54.4 kg)   SpO2 97%   BMI 22.67 kg/m    General: Well-nourished, well-developed in no acute distress.  Eyes: No icterus. Mouth: Oropharyngeal mucosa moist and pink   Lungs: Clear to auscultation bilaterally.  Heart: Regular rate and rhythm, no murmurs rubs or gallops.  Abdomen: Bowel sounds are normal, nontender, nondistended, no hepatosplenomegaly or masses,  no abdominal bruits or hernia , no rebound or guarding.  Rectal: not performed Extremities: No lower extremity edema. No clubbing or deformities. Neuro: Alert and oriented x 4   Skin: Warm and dry, no jaundice.   Psych: Alert and cooperative, normal mood and affect.  Labs   Lab Results  Component Value Date   NA 137 12/07/2022    CL 104 12/07/2022   K 3.9 12/07/2022   CO2 25 12/07/2022   BUN 24 (H) 12/07/2022   CREATININE 0.83 12/07/2022   GFRNONAA >60 12/07/2022   CALCIUM 8.9 12/07/2022   GLUCOSE 97 12/07/2022   Lab Results  Component Value Date   WBC 16.0 (H) 12/07/2022   HGB 13.1 12/07/2022   HCT 40.2 12/07/2022   MCV 90.7 12/07/2022   PLT 194 12/07/2022   No results found for: "ALT", "AST", "GGT", "ALKPHOS", "BILITOT"  Imaging Studies   No results found.  Assessment   *Diarrhea *Bloating *Black stools  Likely dealing with recent acute gastroenteritis. Suspect black stool due to Kaopectate and Pepto. She denies symptoms suggestive of anemia. Continue supportive measures. Will monitor for resolution of black stools off bismuth products. F/u recent labs. If ongoing symptoms, she will need additional work up.   PLAN   Stop Kaopectate and PeptoBismol. Monitor for persistent black stools. May take 2-3 days to clear black stool. Use imodium 2mg  up to TID prn. Call in 1-2 weeks if ongoing symptoms. If ongoing bloating, would consider CT. If ongoing loose stools, would consider stool testing for infection and/or colonoscopy.  We will call PCP for recent lab results.   Leanna Battles. Melvyn Neth, MHS, PA-C [facility]

## 2023-04-14 ENCOUNTER — Encounter: Payer: Self-pay | Admitting: Gastroenterology

## 2023-04-14 ENCOUNTER — Ambulatory Visit: Payer: Medicare Other | Admitting: Gastroenterology

## 2023-04-14 VITALS — BP 134/65 | HR 79 | Temp 98.0°F | Ht 61.0 in | Wt 120.0 lb

## 2023-04-14 DIAGNOSIS — R197 Diarrhea, unspecified: Secondary | ICD-10-CM | POA: Diagnosis not present

## 2023-04-14 DIAGNOSIS — R140 Abdominal distension (gaseous): Secondary | ICD-10-CM | POA: Insufficient documentation

## 2023-04-14 NOTE — Patient Instructions (Signed)
Stop Kaopectate and PeptoBismol. These are likely the cause of your black stools.  You may use imodium if needed. Take one tablet if you have two loose stools in a day. You may take additional tablet if ongoing loose stools but do not exceed more than 3 tablets in 24 hours.  Call in 1-2 weeks and let me know how your stools and bloating are. If you continue to have bloating, we will offer you a CT scan. If ongoing loose stools, we may need to consider stool testing for infection and/or colonoscopy. If your stools remain black after you have been off Kaopectate and PeptoBismol more at least 48 hours, please let me know.

## 2023-04-15 ENCOUNTER — Telehealth: Payer: Self-pay

## 2023-04-15 NOTE — Telephone Encounter (Signed)
Outside labs are scanned under media for review.

## 2023-04-16 ENCOUNTER — Telehealth: Payer: Self-pay

## 2023-04-16 NOTE — Telephone Encounter (Signed)
Pt called wanting to know if you have had a chance to look at the labs from her PCP and to see if you had any further recommendations since her hgb was normal.

## 2023-04-17 ENCOUNTER — Other Ambulatory Visit: Payer: Self-pay

## 2023-04-17 DIAGNOSIS — R195 Other fecal abnormalities: Secondary | ICD-10-CM

## 2023-04-17 NOTE — Telephone Encounter (Signed)
Pt was made aware and labs were ordered. Pt will pick up hemoccult cards on Monday.

## 2023-04-17 NOTE — Telephone Encounter (Signed)
It can take several days for stools to return to normal but out of abundance of precaution, let's have her check her Hgb again for comparison to make sure it is not dropping.   CBC at [facility] or [facility] please. She can also complete 3 hemoccults or ifobt if we have any.

## 2023-04-17 NOTE — Telephone Encounter (Signed)
Looked at labs. Hgb normal. Suspect black stools were due to medication. Recommendations as outlined at time of ov. She should call with progress report in 1-2 weeks.

## 2023-04-17 NOTE — Telephone Encounter (Signed)
Pt states that that her stool is still black and she has been off of the kaopectate and Pepto. Please advise.

## 2023-04-18 LAB — CBC WITH DIFFERENTIAL/PLATELET
Basophils Absolute: 0 10*3/uL (ref 0.0–0.2)
Basos: 0 %
EOS (ABSOLUTE): 0 10*3/uL (ref 0.0–0.4)
Eos: 0 %
Hematocrit: 35.9 % (ref 34.0–46.6)
Hemoglobin: 12.2 g/dL (ref 11.1–15.9)
Immature Grans (Abs): 0 10*3/uL (ref 0.0–0.1)
Immature Granulocytes: 1 %
Lymphocytes Absolute: 1 10*3/uL (ref 0.7–3.1)
Lymphs: 13 %
MCH: 29.7 pg (ref 26.6–33.0)
MCHC: 34 g/dL (ref 31.5–35.7)
MCV: 87 fL (ref 79–97)
Monocytes Absolute: 0.4 10*3/uL (ref 0.1–0.9)
Monocytes: 5 %
Neutrophils Absolute: 6.4 10*3/uL (ref 1.4–7.0)
Neutrophils: 81 %
Platelets: 228 10*3/uL (ref 150–450)
RBC: 4.11 x10E6/uL (ref 3.77–5.28)
RDW: 11.8 % (ref 11.7–15.4)
WBC: 8 10*3/uL (ref 3.4–10.8)

## 2023-04-21 ENCOUNTER — Ambulatory Visit (INDEPENDENT_AMBULATORY_CARE_PROVIDER_SITE_OTHER): Payer: Medicare Other | Admitting: Gastroenterology

## 2023-04-21 ENCOUNTER — Other Ambulatory Visit: Payer: Self-pay

## 2023-04-21 DIAGNOSIS — R195 Other fecal abnormalities: Secondary | ICD-10-CM

## 2023-04-21 DIAGNOSIS — R197 Diarrhea, unspecified: Secondary | ICD-10-CM | POA: Diagnosis not present

## 2023-04-21 LAB — POC HEMOCCULT BLD/STL (OFFICE/1-CARD/DIAGNOSTIC): Fecal Occult Blood, POC: NEGATIVE

## 2023-04-23 ENCOUNTER — Telehealth: Payer: Self-pay

## 2023-04-23 NOTE — Telephone Encounter (Signed)
If her stools are loose, lets have her complete Cdiff gdh and GI path panel with [facility].

## 2023-04-23 NOTE — Telephone Encounter (Signed)
Pt called stating that the black stools have went away but that she is having an issues with blobs of mucous coming out with her stool and even sometimes in her pads when she is not having a bm. Pt is wanting to know if she should be concerned.

## 2023-04-24 NOTE — Telephone Encounter (Signed)
Pt was made aware and verbalized understanding. Appt was made for 05/22/23

## 2023-04-24 NOTE — Telephone Encounter (Signed)
Pt states that her stools are formed but that she is having issues with white mucous.

## 2023-04-24 NOTE — Telephone Encounter (Signed)
Ok if stools are formed, no stool studies needed.   Let's have her start a probiotic once daily. Ermalene Searing, [facility] are options.   Let's have her back in for a follow up in four weeks. If she is much better at that time she can always cancel appt.

## 2023-05-05 DIAGNOSIS — K080 Exfoliation of teeth due to systemic causes: Secondary | ICD-10-CM | POA: Diagnosis not present

## 2023-05-22 ENCOUNTER — Ambulatory Visit: Payer: Medicare Other | Admitting: Gastroenterology

## 2023-06-16 ENCOUNTER — Encounter: Payer: Self-pay | Admitting: Podiatry

## 2023-06-16 ENCOUNTER — Ambulatory Visit (INDEPENDENT_AMBULATORY_CARE_PROVIDER_SITE_OTHER): Payer: Medicare Other | Admitting: Podiatry

## 2023-06-16 DIAGNOSIS — D2371 Other benign neoplasm of skin of right lower limb, including hip: Secondary | ICD-10-CM | POA: Diagnosis not present

## 2023-06-16 DIAGNOSIS — D2372 Other benign neoplasm of skin of left lower limb, including hip: Secondary | ICD-10-CM

## 2023-06-16 NOTE — Progress Notes (Signed)
Presents today chief complaint benign skin lesion plantar aspect of bilateral foot.  Objective: Thick callused areas bilateral foot.  These are painful on palpation.  These are beneath the metatarsal heads 2 3 and 4 on the right foot beneath the fifth met head on the right left foot.  Assessment: Benign skin lesions bilateral.  Plan: Debrided benign skin lesions bilateral follow-up with her as needed.

## 2023-06-17 DIAGNOSIS — M62830 Muscle spasm of back: Secondary | ICD-10-CM | POA: Diagnosis not present

## 2023-06-17 DIAGNOSIS — M9902 Segmental and somatic dysfunction of thoracic region: Secondary | ICD-10-CM | POA: Diagnosis not present

## 2023-06-17 DIAGNOSIS — M9903 Segmental and somatic dysfunction of lumbar region: Secondary | ICD-10-CM | POA: Diagnosis not present

## 2023-06-17 DIAGNOSIS — M9905 Segmental and somatic dysfunction of pelvic region: Secondary | ICD-10-CM | POA: Diagnosis not present

## 2023-06-22 DIAGNOSIS — K080 Exfoliation of teeth due to systemic causes: Secondary | ICD-10-CM | POA: Diagnosis not present

## 2023-06-24 ENCOUNTER — Ambulatory Visit: Payer: Medicare Other | Admitting: Gastroenterology

## 2023-06-24 ENCOUNTER — Encounter: Payer: Self-pay | Admitting: Gastroenterology

## 2023-06-24 VITALS — BP 137/68 | HR 73 | Temp 98.4°F | Ht 61.0 in | Wt 121.8 lb

## 2023-06-24 DIAGNOSIS — R195 Other fecal abnormalities: Secondary | ICD-10-CM | POA: Diagnosis not present

## 2023-06-24 DIAGNOSIS — R159 Full incontinence of feces: Secondary | ICD-10-CM | POA: Diagnosis not present

## 2023-06-24 DIAGNOSIS — R194 Change in bowel habit: Secondary | ICD-10-CM

## 2023-06-24 NOTE — Patient Instructions (Addendum)
Start Benefiber 2 teaspoons daily for one week, then increase to 2 teaspoons twice daily. Continue [employer]. I will discuss case with Dr. Jena Gauss to get his opinion regarding possible colonoscopy. Further recommendations to follow.

## 2023-06-24 NOTE — Progress Notes (Signed)
GI Office Note    Referring Provider: Donetta Potts, MD Primary Care Physician:  Donetta Potts, MD  Primary Gastroenterologist: Roetta Sessions, MD    Chief Complaint   Chief Complaint  Patient presents with   abnormal bowel movements    Having issues with bowel movements. States that her stools have mucous in them and has a very foul odor. Pt states that there are also times where there will be mucous and stool on her pad and doesn't know it has happened until afterwards.     History of Present Illness   Linda Stein is a 89 y.o. female presenting today for follow-up.  She was last seen in September 2024.  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  At time of last office visit she reported recent onset diarrhea, black loose stools.  She did take Kaopectate and Pepto-Bismol during this timeframe.  It was felt that she likely had acute gastroenteritis with black stools related to medication.  Hemoglobin was normal.  Stool Hemoccult was negative.  She presents today with concern for passing a lot of mucous in her stool, which is malodorous. She has had episodes after a BM, which she will notice stool/mucous on her pad and was not aware she had passed it. Typically having 1-2 stools per day. No melena, brbpr. No rectal pain, abdominal pain. Feels like she is having to clean herself too much because of the passing of mucous and stool throughout the day. Stools seem productive, [place] 3/4. Patient taking [facility] daily.  Last tcs 2005 with Dr. Karilyn Cota. Exam somewhat compromised because she was unable to complete prep. Normal colonoscopy otherwise.   EGD March 2023: -Z-line regular, 36 cm from the incisors. - Gastritis. Biopsied. No h.pylori - Normal duodenal bulb, first portion of the duodenum and second portion of the duodenum.    Medications   Current Outpatient Medications  Medication Sig Dispense Refill   Cholecalciferol  (VITAMIN D3 PO) Take 5,000 Units by mouth in the morning.     pantoprazole (PROTONIX) 40 MG tablet Take 40 mg by mouth in the morning.     predniSONE (DELTASONE) 1 MG tablet Take 4 mg by mouth daily with breakfast.     Probiotic Product ([facility] PO) Take 1 capsule by mouth daily.     No current facility-administered medications for this visit.    Allergies   Allergies as of 06/24/2023 - Review Complete 06/24/2023  Allergen Reaction Noted   Plaquenil [hydroxychloroquine] Rash 01/23/2023   Sulfa antibiotics Other (See Comments) 06/07/2014     Past Medical History   Past Medical History:  Diagnosis Date   Atrophic vaginitis    Diarrhea    GERD (gastroesophageal reflux disease)    History of kidney stones    Irritable bowel syndrome     Past Surgical History   Past Surgical History:  Procedure Laterality Date   BIOPSY  10/16/2021   Procedure: BIOPSY;  Surgeon: Lanelle Bal, DO;  [facility];  Service: Endoscopy;;   Cataract surgery  2021   CESAREAN SECTION  1962   ESOPHAGOGASTRODUODENOSCOPY (EGD) WITH PROPOFOL N/A 10/16/2021   Procedure: ESOPHAGOGASTRODUODENOSCOPY (EGD) WITH PROPOFOL;  Surgeon: Lanelle Bal, DO;  [facility];  Service: Endoscopy;  Laterality: N/A;  11:30am   NEPHROLITHOTOMY Right 03/20/2021   Procedure: FIRST STAGE RIGHT NEPHROLITHOTOMY PERCUTANEOUS WITH  SURGEON ACCESS CYSTOSCOPY RIGHT RETROGRADE;  Surgeon: Sebastian Ache, MD;  Location: [facility];  Service: Urology;  Laterality: Right;   NEPHROLITHOTOMY Right 03/22/2021   Procedure: NEPHROLITHOTOMY PERCUTANEOUS SECOND LOOK, URETERAL STENT PLACEMENT,LEFT;  Surgeon: Sebastian Ache, MD;  Location: [facility];  Service: Urology;  Laterality: Right;    Past Family History   Family History  Problem Relation Age of Onset   Other Mother        aneursym   Stroke Mother    Cancer Father    Other Son        hip replacement   Colon cancer Neg Hx     Past Social History    Social History   Socioeconomic History   Marital status: Widowed    Spouse name: Not on file   Number of children: Not on file   Years of education: Not on file   Highest education level: Not on file  Occupational History   Not on file  Tobacco Use   Smoking status: Former    Types: Cigarettes   Smokeless tobacco: Never  Vaping Use   Vaping status: Never Used  Substance and Sexual Activity   Alcohol use: No   Drug use: No   Sexual activity: Not Currently    Birth control/protection: Post-menopausal  Other Topics Concern   Not on file  Social History Narrative   Not on file   Social Determinants of Health   Financial Resource Strain: Not on file  Food Insecurity: Not on file  Transportation Needs: Not on file  Physical Activity: Not on file  Stress: Not on file  Social Connections: Not on file  Intimate Partner Violence: Not on file    Review of Systems   General: Negative for anorexia, weight loss, fever, chills, fatigue, weakness. ENT: Negative for hoarseness, difficulty swallowing , nasal congestion. CV: Negative for chest pain, angina, palpitations, dyspnea on exertion, peripheral edema.  Respiratory: Negative for dyspnea at rest, dyspnea on exertion, cough, sputum, wheezing.  GI: See history of present illness. GU:  Negative for dysuria, hematuria, urinary incontinence, urinary frequency, nocturnal urination.  Endo: Negative for unusual weight change.     Physical Exam   BP 137/68 (BP Location: Right Arm, Patient Position: Sitting, Cuff Size: Normal)   Pulse 73   Temp 98.4 F (36.9 C) (Oral)   Ht 5\' 1"  (1.549 m)   Wt 121 lb 12.8 oz (55.2 kg)   SpO2 97%   BMI 23.01 kg/m    General: Well-nourished, well-developed in no acute distress.  Eyes: No icterus. Mouth: Oropharyngeal mucosa moist and pink   Lungs: Clear to auscultation bilaterally.  Heart: Regular rate and rhythm, no murmurs rubs or gallops.  Abdomen: Bowel sounds are normal, nontender,  nondistended, no hepatosplenomegaly or masses,  no abdominal bruits or hernia , no rebound or guarding.  Rectal: no external lesions, no palpable masses, no stool in rectal vault. Somewhat lose sphincter tone Extremities: No lower extremity edema. No clubbing or deformities. Neuro: Alert and oriented x 4   Skin: Warm and dry, no jaundice.   Psych: Alert and cooperative, normal mood and affect.  Labs   Lab Results  Component Value Date   WBC 8.0 04/17/2023   HGB 12.2 04/17/2023   HCT 35.9 04/17/2023   MCV 87 04/17/2023   PLT 228 04/17/2023    Imaging Studies   No results found.  Assessment/Plan:   Change in bowel/mucous in stool/seepage -Benefiber 2 teaspoons daily for one week, then increase to BID. -continue [facility] -patient may benefit from colonoscopy  to rule out polyp, proctitis, malignancy. To discuss with Dr. Jena Gauss.    Leanna Battles. Melvyn Neth, MHS, PA-C [facility]

## 2023-06-25 DIAGNOSIS — L821 Other seborrheic keratosis: Secondary | ICD-10-CM | POA: Diagnosis not present

## 2023-06-25 DIAGNOSIS — H61001 Unspecified perichondritis of right external ear: Secondary | ICD-10-CM | POA: Diagnosis not present

## 2023-06-25 DIAGNOSIS — L820 Inflamed seborrheic keratosis: Secondary | ICD-10-CM | POA: Diagnosis not present

## 2023-06-25 DIAGNOSIS — D225 Melanocytic nevi of trunk: Secondary | ICD-10-CM | POA: Diagnosis not present

## 2023-06-29 DIAGNOSIS — M9903 Segmental and somatic dysfunction of lumbar region: Secondary | ICD-10-CM | POA: Diagnosis not present

## 2023-06-29 DIAGNOSIS — M62830 Muscle spasm of back: Secondary | ICD-10-CM | POA: Diagnosis not present

## 2023-06-29 DIAGNOSIS — M9902 Segmental and somatic dysfunction of thoracic region: Secondary | ICD-10-CM | POA: Diagnosis not present

## 2023-06-29 DIAGNOSIS — K080 Exfoliation of teeth due to systemic causes: Secondary | ICD-10-CM | POA: Diagnosis not present

## 2023-06-29 DIAGNOSIS — M9905 Segmental and somatic dysfunction of pelvic region: Secondary | ICD-10-CM | POA: Diagnosis not present

## 2023-07-01 ENCOUNTER — Telehealth: Payer: Self-pay | Admitting: Gastroenterology

## 2023-07-01 NOTE — Telephone Encounter (Signed)
Please let pt know that after discussion with Dr. Jena Gauss, he is recommending offering a colonoscopy for change in bowels. ASA 2. Please schedule if pt agreeable.

## 2023-07-01 NOTE — Telephone Encounter (Signed)
Lmom for pt to return call. 

## 2023-07-01 NOTE — Telephone Encounter (Signed)
Pt was made aware and stated that she will contact us if she decides to have it done.

## 2023-07-03 DIAGNOSIS — M9903 Segmental and somatic dysfunction of lumbar region: Secondary | ICD-10-CM | POA: Diagnosis not present

## 2023-07-03 DIAGNOSIS — M9902 Segmental and somatic dysfunction of thoracic region: Secondary | ICD-10-CM | POA: Diagnosis not present

## 2023-07-03 DIAGNOSIS — M9905 Segmental and somatic dysfunction of pelvic region: Secondary | ICD-10-CM | POA: Diagnosis not present

## 2023-07-03 DIAGNOSIS — M62830 Muscle spasm of back: Secondary | ICD-10-CM | POA: Diagnosis not present

## 2023-07-06 DIAGNOSIS — M353 Polymyalgia rheumatica: Secondary | ICD-10-CM | POA: Diagnosis not present

## 2023-07-06 DIAGNOSIS — M1991 Primary osteoarthritis, unspecified site: Secondary | ICD-10-CM | POA: Diagnosis not present

## 2023-07-06 DIAGNOSIS — M064 Inflammatory polyarthropathy: Secondary | ICD-10-CM | POA: Diagnosis not present

## 2023-07-06 DIAGNOSIS — Z7952 Long term (current) use of systemic steroids: Secondary | ICD-10-CM | POA: Diagnosis not present

## 2023-07-07 DIAGNOSIS — M62830 Muscle spasm of back: Secondary | ICD-10-CM | POA: Diagnosis not present

## 2023-07-07 DIAGNOSIS — M9903 Segmental and somatic dysfunction of lumbar region: Secondary | ICD-10-CM | POA: Diagnosis not present

## 2023-07-07 DIAGNOSIS — M9902 Segmental and somatic dysfunction of thoracic region: Secondary | ICD-10-CM | POA: Diagnosis not present

## 2023-07-07 DIAGNOSIS — M9905 Segmental and somatic dysfunction of pelvic region: Secondary | ICD-10-CM | POA: Diagnosis not present

## 2023-08-31 ENCOUNTER — Ambulatory Visit: Payer: Medicare Other | Admitting: Podiatry

## 2023-09-03 DIAGNOSIS — L820 Inflamed seborrheic keratosis: Secondary | ICD-10-CM | POA: Diagnosis not present

## 2023-09-15 ENCOUNTER — Ambulatory Visit: Payer: Medicare Other | Admitting: Podiatry

## 2023-09-16 DIAGNOSIS — M353 Polymyalgia rheumatica: Secondary | ICD-10-CM | POA: Diagnosis not present

## 2023-09-16 DIAGNOSIS — M1991 Primary osteoarthritis, unspecified site: Secondary | ICD-10-CM | POA: Diagnosis not present

## 2023-09-16 DIAGNOSIS — M5416 Radiculopathy, lumbar region: Secondary | ICD-10-CM | POA: Diagnosis not present

## 2023-09-16 DIAGNOSIS — M064 Inflammatory polyarthropathy: Secondary | ICD-10-CM | POA: Diagnosis not present

## 2023-09-18 ENCOUNTER — Ambulatory Visit: Payer: Medicare Other | Admitting: Podiatry

## 2023-09-18 ENCOUNTER — Encounter: Payer: Self-pay | Admitting: Podiatry

## 2023-09-18 VITALS — Ht 61.0 in | Wt 121.0 lb

## 2023-09-18 DIAGNOSIS — D2372 Other benign neoplasm of skin of left lower limb, including hip: Secondary | ICD-10-CM | POA: Diagnosis not present

## 2023-09-18 DIAGNOSIS — D2371 Other benign neoplasm of skin of right lower limb, including hip: Secondary | ICD-10-CM | POA: Diagnosis not present

## 2023-09-18 NOTE — Progress Notes (Signed)
 Presents today chief complaint benign skin lesion plantar aspect of bilateral foot.  Objective: Thick callused areas bilateral foot.  These are painful on palpation.  These are beneath the metatarsal heads 2 3 and 4 on the right foot beneath the fifth met head on the right left foot.  Assessment: Benign skin lesions bilateral.  Plan: Debrided benign skin lesions bilateral follow-up with her as needed.

## 2023-10-01 ENCOUNTER — Ambulatory Visit: Payer: Medicare Other | Admitting: Podiatry

## 2023-10-06 DIAGNOSIS — M7918 Myalgia, other site: Secondary | ICD-10-CM | POA: Diagnosis not present

## 2023-10-06 DIAGNOSIS — M545 Low back pain, unspecified: Secondary | ICD-10-CM | POA: Diagnosis not present

## 2023-10-20 DIAGNOSIS — M545 Low back pain, unspecified: Secondary | ICD-10-CM | POA: Diagnosis not present

## 2023-11-03 DIAGNOSIS — Z6822 Body mass index (BMI) 22.0-22.9, adult: Secondary | ICD-10-CM | POA: Diagnosis not present

## 2023-11-03 DIAGNOSIS — K219 Gastro-esophageal reflux disease without esophagitis: Secondary | ICD-10-CM | POA: Diagnosis not present

## 2023-11-03 DIAGNOSIS — R197 Diarrhea, unspecified: Secondary | ICD-10-CM | POA: Diagnosis not present

## 2023-11-03 DIAGNOSIS — M1990 Unspecified osteoarthritis, unspecified site: Secondary | ICD-10-CM | POA: Diagnosis not present

## 2023-11-03 DIAGNOSIS — M810 Age-related osteoporosis without current pathological fracture: Secondary | ICD-10-CM | POA: Diagnosis not present

## 2023-11-12 NOTE — Progress Notes (Unsigned)
 GI Office Note    Referring Provider: Donetta Potts, MD Primary Care Physician:  Donetta Potts, MD  Primary Gastroenterologist: Roetta Sessions, MD   Chief Complaint   No chief complaint on file.   History of Present Illness   Linda Stein is a 89 y.o. female presenting today for follow up. Last seen 06/2023. H/o postinfectious IBS, GERD.   She had hemoccult negative stool 04/2023. No anemia. We offered colonoscopy after last ov to further evaluation her symptoms but she declined.  Last tcs 2005 with Dr. Karilyn Cota. Exam somewhat compromised because she was unable to complete prep. Normal colonoscopy otherwise.    EGD March 2023: -Z-line regular, 36 cm from the incisors. - Gastritis. Biopsied. No h.pylori - Normal duodenal bulb, first portion of the duodenum and second portion of the duodenum.     Medications   Current Outpatient Medications  Medication Sig Dispense Refill   Cholecalciferol (VITAMIN D3 PO) Take 5,000 Units by mouth in the morning.     pantoprazole (PROTONIX) 40 MG tablet Take 40 mg by mouth in the morning.     predniSONE (DELTASONE) 1 MG tablet Take 4 mg by mouth daily with breakfast.     Probiotic Product (PHILLIPS COLON HEALTH PO) Take 1 capsule by mouth daily.     No current facility-administered medications for this visit.    Allergies   Allergies as of 11/13/2023 - Review Complete 09/18/2023  Allergen Reaction Noted   Plaquenil [hydroxychloroquine] Rash 01/23/2023   Sulfa antibiotics Other (See Comments) 06/07/2014     Past Medical History   Past Medical History:  Diagnosis Date   Atrophic vaginitis    Diarrhea    GERD (gastroesophageal reflux disease)    History of kidney stones    Irritable bowel syndrome     Past Surgical History   Past Surgical History:  Procedure Laterality Date   BIOPSY  10/16/2021   Procedure: BIOPSY;  Surgeon: Lanelle Bal, DO;  Location: [facility];  Service: Endoscopy;;   Cataract  surgery  2021   CESAREAN SECTION  1962   ESOPHAGOGASTRODUODENOSCOPY (EGD) WITH PROPOFOL N/A 10/16/2021   Procedure: ESOPHAGOGASTRODUODENOSCOPY (EGD) WITH PROPOFOL;  Surgeon: Lanelle Bal, DO;  Location: [facility];  Service: Endoscopy;  Laterality: N/A;  11:30am   NEPHROLITHOTOMY Right 03/20/2021   Procedure: FIRST STAGE RIGHT NEPHROLITHOTOMY PERCUTANEOUS WITH  SURGEON ACCESS CYSTOSCOPY RIGHT RETROGRADE;  Surgeon: Sebastian Ache, MD;  Location: [facility];  Service: Urology;  Laterality: Right;   NEPHROLITHOTOMY Right 03/22/2021   Procedure: NEPHROLITHOTOMY PERCUTANEOUS SECOND LOOK, URETERAL STENT PLACEMENT,LEFT;  Surgeon: Sebastian Ache, MD;  Location: [facility];  Service: Urology;  Laterality: Right;    Past Family History   Family History  Problem Relation Age of Onset   Other Mother        aneursym   Stroke Mother    Cancer Father    Other Son        hip replacement   Colon cancer Neg Hx     Past Social History   Social History   Socioeconomic History   Marital status: Widowed    Spouse name: Not on file   Number of children: Not on file   Years of education: Not on file   Highest education level: Not on file  Occupational History   Not on file  Tobacco Use   Smoking status: Former    Types: Cigarettes   Smokeless tobacco: Never  Vaping Use  Vaping status: Never Used  Substance and Sexual Activity   Alcohol use: No   Drug use: No   Sexual activity: Not Currently    Birth control/protection: Post-menopausal  Other Topics Concern   Not on file  Social History Narrative   Not on file   Social Drivers of Health   Financial Resource Strain: Not on file  Food Insecurity: Not on file  Transportation Needs: Not on file  Physical Activity: Not on file  Stress: Not on file  Social Connections: Not on file  Intimate Partner Violence: Not on file    Review of Systems   General: Negative for anorexia, weight loss, fever, chills, fatigue, weakness. ENT:  Negative for hoarseness, difficulty swallowing , nasal congestion. CV: Negative for chest pain, angina, palpitations, dyspnea on exertion, peripheral edema.  Respiratory: Negative for dyspnea at rest, dyspnea on exertion, cough, sputum, wheezing.  GI: See history of present illness. GU:  Negative for dysuria, hematuria, urinary incontinence, urinary frequency, nocturnal urination.  Endo: Negative for unusual weight change.     Physical Exam   There were no vitals taken for this visit.   General: Well-nourished, well-developed in no acute distress.  Eyes: No icterus. Mouth: Oropharyngeal mucosa moist and pink , no lesions erythema or exudate. Lungs: Clear to auscultation bilaterally.  Heart: Regular rate and rhythm, no murmurs rubs or gallops.  Abdomen: Bowel sounds are normal, nontender, nondistended, no hepatosplenomegaly or masses,  no abdominal bruits or hernia , no rebound or guarding.  Rectal: ***  Extremities: No lower extremity edema. No clubbing or deformities. Neuro: Alert and oriented x 4   Skin: Warm and dry, no jaundice.   Psych: Alert and cooperative, normal mood and affect.  Labs   Lab Results  Component Value Date   NA 137 12/07/2022   CL 104 12/07/2022   K 3.9 12/07/2022   CO2 25 12/07/2022   BUN 24 (H) 12/07/2022   CREATININE 0.83 12/07/2022   GFRNONAA >60 12/07/2022   CALCIUM 8.9 12/07/2022   GLUCOSE 97 12/07/2022   Lab Results  Component Value Date   WBC 8.0 04/17/2023   HGB 12.2 04/17/2023   HCT 35.9 04/17/2023   MCV 87 04/17/2023   PLT 228 04/17/2023   No results found for: "ALT", "AST", "GGT", "ALKPHOS", "BILITOT" No results found for: "TSH"  Imaging Studies   No results found.  Assessment       PLAN   ***   Leanna Battles. Melvyn Neth, MHS, PA-C [facility]

## 2023-11-13 ENCOUNTER — Encounter: Payer: Self-pay | Admitting: Gastroenterology

## 2023-11-13 ENCOUNTER — Ambulatory Visit: Admitting: Gastroenterology

## 2023-11-13 VITALS — BP 139/68 | HR 75 | Temp 98.0°F | Ht 61.0 in | Wt 122.6 lb

## 2023-11-13 DIAGNOSIS — K219 Gastro-esophageal reflux disease without esophagitis: Secondary | ICD-10-CM | POA: Diagnosis not present

## 2023-11-13 MED ORDER — OMEPRAZOLE 20 MG PO CPDR: 20.0000 mg | DELAYED_RELEASE_CAPSULE | Freq: Every day | ORAL | 3 refills | Status: DC

## 2023-11-13 NOTE — Patient Instructions (Signed)
 Please start omeprazole 20mg  daily before breakfast. You can still use Maalox as needed. If your symptoms do not settle down within two weeks, you may increase omeprazole to twice daily before breakfast and evening meal.  Please let me know if symptoms do not settle down, would consider further evaluation by abdominal ultrasound at that time.

## 2023-11-17 ENCOUNTER — Ambulatory Visit: Admitting: Podiatry

## 2023-11-17 ENCOUNTER — Encounter: Payer: Self-pay | Admitting: Podiatry

## 2023-11-17 DIAGNOSIS — D2372 Other benign neoplasm of skin of left lower limb, including hip: Secondary | ICD-10-CM

## 2023-11-17 NOTE — Progress Notes (Signed)
 She presents today was here just 1 month ago to see Dr. Allena Katz for her calluses.  States that they came back very quickly.  Objective: Vital signs are stable alert and oriented x 3 there is no erythema edema cellulitis drainage or odor punctated benign skin lesions bilateral foot.  Painful on palpation.  Assessment: Pain limb secondary to benign skin lesions left and right foot.  Plan: Debridement of benign skin lesions bilateral placed padding.  Follow-up in 3 months

## 2023-11-18 ENCOUNTER — Telehealth: Payer: Self-pay

## 2023-11-18 ENCOUNTER — Encounter (HOSPITAL_COMMUNITY): Payer: Self-pay | Admitting: Emergency Medicine

## 2023-11-18 ENCOUNTER — Other Ambulatory Visit: Payer: Self-pay

## 2023-11-18 ENCOUNTER — Emergency Department (HOSPITAL_COMMUNITY)
Admission: EM | Admit: 2023-11-18 | Discharge: 2023-11-18 | Disposition: A | Discharge status: Home / Self Care | Attending: Emergency Medicine | Admitting: Emergency Medicine

## 2023-11-18 DIAGNOSIS — R0989 Other specified symptoms and signs involving the circulatory and respiratory systems: Secondary | ICD-10-CM | POA: Diagnosis not present

## 2023-11-18 DIAGNOSIS — F458 Other somatoform disorders: Secondary | ICD-10-CM | POA: Insufficient documentation

## 2023-11-18 DIAGNOSIS — R0789 Other chest pain: Secondary | ICD-10-CM | POA: Diagnosis not present

## 2023-11-18 DIAGNOSIS — R09A2 Foreign body sensation, throat: Secondary | ICD-10-CM

## 2023-11-18 LAB — CBC WITH DIFFERENTIAL/PLATELET
Abs Immature Granulocytes: 0.02 10*3/uL (ref 0.00–0.07)
Basophils Absolute: 0 10*3/uL (ref 0.0–0.1)
Basophils Relative: 1 %
Eosinophils Absolute: 0 10*3/uL (ref 0.0–0.5)
Eosinophils Relative: 1 %
HCT: 35.9 % — ABNORMAL LOW (ref 36.0–46.0)
Hemoglobin: 11.9 g/dL — ABNORMAL LOW (ref 12.0–15.0)
Immature Granulocytes: 0 %
Lymphocytes Relative: 9 %
Lymphs Abs: 0.6 10*3/uL — ABNORMAL LOW (ref 0.7–4.0)
MCH: 29.4 pg (ref 26.0–34.0)
MCHC: 33.1 g/dL (ref 30.0–36.0)
MCV: 88.6 fL (ref 80.0–100.0)
Monocytes Absolute: 0.3 10*3/uL (ref 0.1–1.0)
Monocytes Relative: 4 %
Neutro Abs: 5.4 10*3/uL (ref 1.7–7.7)
Neutrophils Relative %: 85 %
Platelets: 190 10*3/uL (ref 150–400)
RBC: 4.05 MIL/uL (ref 3.87–5.11)
RDW: 13.4 % (ref 11.5–15.5)
WBC: 6.3 10*3/uL (ref 4.0–10.5)
nRBC: 0 % (ref 0.0–0.2)

## 2023-11-18 LAB — COMPREHENSIVE METABOLIC PANEL WITH GFR
ALT: 12 U/L (ref 0–44)
AST: 19 U/L (ref 15–41)
Albumin: 3.8 g/dL (ref 3.5–5.0)
Alkaline Phosphatase: 47 U/L (ref 38–126)
Anion gap: 8 (ref 5–15)
BUN: 22 mg/dL (ref 8–23)
CO2: 23 mmol/L (ref 22–32)
Calcium: 9.2 mg/dL (ref 8.9–10.3)
Chloride: 106 mmol/L (ref 98–111)
Creatinine, Ser: 0.88 mg/dL (ref 0.44–1.00)
GFR, Estimated: >60 mL/min (ref >60)
Glucose, Bld: 122 mg/dL — ABNORMAL HIGH (ref 70–99)
Potassium: 3.8 mmol/L (ref 3.5–5.1)
Sodium: 137 mmol/L (ref 135–145)
Total Bilirubin: 0.5 mg/dL (ref 0.0–1.2)
Total Protein: 6.7 g/dL (ref 6.5–8.1)

## 2023-11-18 LAB — TROPONIN I (HIGH SENSITIVITY): Troponin I (High Sensitivity): 4 ng/L (ref <18)

## 2023-11-18 LAB — LIPASE, BLOOD: Lipase: 34 U/L (ref 11–51)

## 2023-11-18 MED ORDER — SUCRALFATE 1 G PO TABS
1.0000 g | ORAL_TABLET | Freq: Once | ORAL | Status: AC
  Administered 2023-11-18: 1 g via ORAL
  Filled 2023-11-18: qty 1

## 2023-11-18 NOTE — Telephone Encounter (Signed)
 Reviewed last OV note with Tana Coast, PA-C on 11/13/23.  Appears that patient was having some similar symptoms in regards to chest pressure at that time.  Queried whether this was related to GERD though she had not had appropriate response to PPI.  Verlon Au changed pantoprazole to omeprazole 20 mg daily.  She recommended 2-week trial of this and if no improvement, could increase omeprazole to 20 mg twice daily.  If persistent symptoms, they are considering additional workup such as ultrasound to evaluate her gallbladder or possibly an upper endoscopy.   Considering patient is reporting a respiratory component to her current chest pressure/pain, I agree with ruling out cardiac etiology.  I will defer additional workup recommendations to [facility] when she returns next week.

## 2023-11-18 NOTE — Telephone Encounter (Signed)
 Noted.

## 2023-11-18 NOTE — ED Provider Notes (Signed)
 [facility] EM[facility]Y DEPARTMENT AT [facility] Provider Note   CSN: [id number] Arrival date & time: 11/18/23  1308     History  Chief Complaint  Patient presents with   Gastroesophageal Reflux   Linda Stein is a 89 y.o. female with a history including GERD, IBS, kidney stones, had an EGD in 2023 secondary to dysphagia and heartburn, revealing for gastritis only presenting for evaluation of persistent globus sensation in her throat in association with hunger sensations that are not relieved by eating a meal.  She denies nausea, vomiting, and does not have classic acid reflux symptoms.  She was recently seen by GI, 5 days ago for these complaints and was switched from Protonix to omeprazole, 20 mg daily with instructions to increase to 40 mg if not effective and to continue using Maalox for as needed use.  After several days of 20 mg dosing she increased to 40 mg of the omeprazole and has had no significant improvement in her symptoms.  She is concerned about other possibilities such as cardiac or gallbladder source of symptoms.  She denies abdominal pain, denies shortness of breath, chest pain, palpitations.  She is able to eat without difficulty, denies choking with swallowing her food, denies weight loss.  Denies radiation of pain into her back, symptoms are not worsened when supine.  The history is provided by the patient.       Home Medications Prior to Admission medications   Medication Sig Start Date End Date Taking? Authorizing Provider  pantoprazole (PROTONIX) 40 MG tablet Take 40 mg by mouth 2 (two) times daily. 11/14/23  Yes [provider]  Cholecalciferol (VITAMIN D3 PO) Take 5,000 Units by mouth in the morning.    [provider]  omeprazole (PRILOSEC) 20 MG capsule Take 1 capsule (20 mg total) by mouth daily before breakfast. 11/13/23   Lanney Pitts, PA-C  predniSONE (DELTASONE) 1 MG tablet Take 4 mg by mouth daily with breakfast.    [provider]  Probiotic Product (PHILLIPS COLON HEALTH PO) Take 1 capsule by mouth daily.    [provider]      Allergies    Plaquenil [hydroxychloroquine] and Sulfa antibiotics    Review of Systems   Review of Systems  Constitutional:  Negative for chills and fever.  HENT:  Negative for congestion and sore throat.   Eyes: Negative.   Respiratory:  Negative for chest tightness and shortness of breath.   Cardiovascular:  Negative for chest pain.  Gastrointestinal:  Negative for abdominal pain, constipation, diarrhea, nausea and vomiting.       Negative except as mentioned in HPI.    Genitourinary: Negative.   Musculoskeletal:  Negative for arthralgias, joint swelling and neck pain.  Skin: Negative.  Negative for rash and wound.  Neurological:  Negative for dizziness, weakness, light-headedness, numbness and headaches.  Psychiatric/Behavioral: Negative.    All other systems reviewed and are negative.   Physical Exam Updated Vital Signs BP (!) 140/74 (BP Location: Right Arm)   Pulse 84   Temp 98.3 F (36.8 C) (Oral)   Resp 16   Ht 5\' 5"  (1.651 m)   Wt 55.8 kg   SpO2 100%   BMI 20.47 kg/m  Physical Exam Vitals and nursing note reviewed.  Constitutional:      Appearance: She is well-developed.  HENT:     Head: Normocephalic and atraumatic.  Eyes:     Conjunctiva/sclera: Conjunctivae normal.  Cardiovascular:  Rate and Rhythm: Normal rate and regular rhythm.     Heart sounds: Normal heart sounds.  Pulmonary:     Effort: Pulmonary effort is normal.     Breath sounds: Normal breath sounds. No wheezing.  Abdominal:     General: Bowel sounds are normal.     Palpations: Abdomen is soft. There is no mass.     Tenderness: There is no abdominal tenderness. There is no guarding.  Musculoskeletal:        General: Normal range of motion.     Cervical back: Normal range of motion.  Skin:    General: Skin is warm and dry.  Neurological:     Mental Status: She  is alert.     ED Results / Procedures / Treatments   Labs (all labs ordered are listed, but only abnormal results are displayed) Labs Reviewed  CBC WITH DIFFERENTIAL/PLATELET - Abnormal; Notable for the following components:      Result Value   Hemoglobin 11.9 (*)    HCT 35.9 (*)    Lymphs Abs 0.6 (*)    All other components within normal limits  COMPREHENSIVE METABOLIC PANEL WITH GFR - Abnormal; Notable for the following components:   Glucose, Bld 122 (*)    All other components within normal limits  LIPASE, BLOOD  TROPONIN I (HIGH SENSITIVITY)    EKG EKG Interpretation Date/Time:  Wednesday November 18 2023 13:23:49 EDT Ventricular Rate:  78 PR Interval:  148 QRS Duration:  88 QT Interval:  386 QTC Calculation: 440 R Axis:   82  Text Interpretation: Normal sinus rhythm Normal ECG No previous ECGs available Confirmed by Early Glisson ([id number]) on 11/18/2023 1:35:37 PM  Radiology No results found.  Procedures Procedures    Medications Ordered in ED Medications  sucralfate (CARAFATE) tablet 1 g (1 g Oral Given 11/18/23 1610)    ED Course/ Medical Decision Making/ A&P                                 Medical Decision Making Patient presenting with a globus sensation for which she has just been seen by GI with recommendations to switch her PPI from Protonix to omeprazole and also to increase her dose if this does not improve her symptoms, she started with her increased dose yesterday and has still had no improvement in her symptoms.  She denies abdominal pain, describes a hunger sensation that is not satisfied by eating a meal, no chest pain, no abdominal pain.  Reached out to her GI specialist who recommended ED visit to rule out cardiac source and also consider gallbladder etiology.  Her exam today is reassuring, she has no abdominal pain on exam, laboratory tests are stable, EKG troponin x 1 negative.  A second troponin had also been ordered but patient chose not to wait for  the second result, stating she was satisfied and ready to go home.  She does have close follow-up care with GI.  There is no evidence today that her symptoms are secondary to ACS or gallbladder pathology.  Offered Carafate as a trial.  Amount and/or Complexity of Data Reviewed External Data Reviewed: labs. Labs: ordered.    Details: Labs including troponin x 1, lipase, c-Met and CBC C are unremarkable.  She does have a mild anemia with a hemoglobin of 11.9, 7 months ago was 12.2, probably lab variance. ECG/medicine tests: ordered and independent interpretation performed.    Details:  Normal sinus rhythm, rate 78  Risk Prescription drug management.           Final Clinical Impression(s) / ED Diagnoses Final diagnoses:  Globus sensation    Rx / DC Orders ED Discharge Orders     None         Alyse July 11/19/23 1448    Early Glisson, MD 11/23/23 [id number]

## 2023-11-18 NOTE — ED Triage Notes (Signed)
 Pt c/o of epigastric discomfort that she is being followed by GI for. Last week she was instructed by GI to increase her reflux medication to BID but pt still has had no relief. Pt reports a lot of belching.

## 2023-11-18 NOTE — Telephone Encounter (Signed)
 Pt called back stating that she went to the ED and they ruled out cardiac issues. Gave her sucralfate in the ED and told her to follow up with GI. Pt has been scheduled with [facility] on 11/30/2023.

## 2023-11-18 NOTE — Discharge Instructions (Signed)
 Your lab tests and  EKG are reassuring today with no obvious abnormalities.  I have prescribed you a medicine called Carafate which you may take before meals to see if this helps your symptoms.  You may need to consider repeat upper endoscopy at I would recommend discussing this with Ms. Lewis or Dr. Marletta Lor to see if they would agree with that plan.  You may need additional test as well to better determine the source of your symptoms.  Do continue taking your omeprazole as prescribed at your last GI visit.

## 2023-11-18 NOTE — Telephone Encounter (Signed)
 Pt called stating that she was having pain/pressure in her chest and was feeling like she needed to take deep breaths. I advised pt to proceed to the ER for evaluation to rule out cardiac issues. Pt felt like it may be reflux issues but given the symptoms and the pt's age I felt like she needed to be evaluated by the ER first. I advised pt to call back after she was evaluated and cardiac issues were ruled out to discuss possible reflux issues. Pt verbalized understanding.

## 2023-11-23 ENCOUNTER — Telehealth: Payer: Self-pay

## 2023-11-23 NOTE — Telephone Encounter (Signed)
 Pt called to see what else can be done for her reflux issues. Pt is aware that you are out until tomorrow.

## 2023-11-24 ENCOUNTER — Other Ambulatory Visit: Payer: Self-pay | Admitting: *Deleted

## 2023-11-24 DIAGNOSIS — R1013 Epigastric pain: Secondary | ICD-10-CM

## 2023-11-24 NOTE — Telephone Encounter (Signed)
 Pt informed that US  is scheduled for Thursday 12/03/23, arrive at 10:15 am,NPO after midnight. Pt states she is getting ready to go out of town. Gave number to CS to call and reschedule US .

## 2023-11-24 NOTE — Telephone Encounter (Signed)
 What is taking as far as PPI, see questions outlined by me below.

## 2023-11-24 NOTE — Telephone Encounter (Signed)
 See other telephone note.

## 2023-11-24 NOTE — Telephone Encounter (Signed)
 Pt was made aware and verbalized understanding. Pt is ready to move forward with scheduling u/s. Appt for 4/21 has been cancelled.

## 2023-11-24 NOTE — Telephone Encounter (Signed)
 Is she taking omeprazole 20mg  BID now? I read in ED note that she increased to 40mg  daily but was not sure if she is taking 20 BID or two 20s at once?  Let me know above.  I think we also need to arrange for abdominal u/s to rule out gallbladder. Dx: epig pain  If u/s neg, and no improvement then next step EGD.   She does not have to come back in to see me right now unless she prefers.

## 2023-11-25 MED ORDER — SUCRALFATE 1 GM/10ML PO SUSP: 1.0000 g | Freq: Three times a day (TID) | ORAL | 1 refills | Status: DC

## 2023-11-25 NOTE — Telephone Encounter (Signed)
 Since she just recently started omeprazole 20mg  BID, let's give short course of carafate before meals and at bedtime.

## 2023-11-25 NOTE — Telephone Encounter (Signed)
 Pt also has u/s scheduled for 12/01/23.

## 2023-11-25 NOTE — Addendum Note (Signed)
 Addended by: Lanney Pitts on: 11/25/2023 09:28 AM   Modules accepted: Orders

## 2023-11-25 NOTE — Telephone Encounter (Signed)
 Pt was made aware and verbalized understanding.

## 2023-11-30 ENCOUNTER — Ambulatory Visit: Admitting: Gastroenterology

## 2023-11-30 DIAGNOSIS — M545 Low back pain, unspecified: Secondary | ICD-10-CM | POA: Diagnosis not present

## 2023-12-01 ENCOUNTER — Ambulatory Visit (HOSPITAL_COMMUNITY)
Admission: RE | Admit: 2023-12-01 | Discharge: 2023-12-01 | Disposition: A | Discharge status: Home / Self Care | Source: Ambulatory Visit | Attending: Gastroenterology | Admitting: Gastroenterology

## 2023-12-01 DIAGNOSIS — R1013 Epigastric pain: Secondary | ICD-10-CM | POA: Insufficient documentation

## 2023-12-01 DIAGNOSIS — N200 Calculus of kidney: Secondary | ICD-10-CM | POA: Diagnosis not present

## 2023-12-01 DIAGNOSIS — K8020 Calculus of gallbladder without cholecystitis without obstruction: Secondary | ICD-10-CM | POA: Diagnosis not present

## 2023-12-03 ENCOUNTER — Other Ambulatory Visit (HOSPITAL_COMMUNITY)

## 2023-12-15 ENCOUNTER — Telehealth: Payer: Self-pay | Admitting: *Deleted

## 2023-12-15 NOTE — Telephone Encounter (Signed)
 Pt called in requesting to schedule an endoscopy. Please advise thanks

## 2023-12-15 NOTE — Telephone Encounter (Signed)
 See result note attached to us  abd complete.

## 2023-12-15 NOTE — Telephone Encounter (Addendum)
 Spoke with pt. She has been scheduled for EGD with Dr. Riley Cheadle, ASA 2 on 5/15. Discussed EGD instructions in detail with patient. She voiced understanding.   Checked carelon and no PA required

## 2023-12-23 NOTE — Telephone Encounter (Signed)
 LMOVM to return call  Pt left vm stating she had an upper partial and wants to know if she needs to take this out prior to her procedure tomorrow.

## 2023-12-23 NOTE — Telephone Encounter (Signed)
 Pt called back. Answered questions.

## 2023-12-24 ENCOUNTER — Ambulatory Visit (HOSPITAL_BASED_OUTPATIENT_CLINIC_OR_DEPARTMENT_OTHER): Admitting: Anesthesiology

## 2023-12-24 ENCOUNTER — Encounter (HOSPITAL_COMMUNITY): Payer: Self-pay | Admitting: Internal Medicine

## 2023-12-24 ENCOUNTER — Ambulatory Visit (HOSPITAL_COMMUNITY)
Admission: RE | Admit: 2023-12-24 | Discharge: 2023-12-24 | Disposition: A | Discharge status: Home / Self Care | Attending: Internal Medicine | Admitting: Internal Medicine

## 2023-12-24 ENCOUNTER — Ambulatory Visit (HOSPITAL_COMMUNITY): Admitting: Anesthesiology

## 2023-12-24 ENCOUNTER — Encounter (HOSPITAL_COMMUNITY)
Admission: RE | Disposition: A | Discharge status: Home / Self Care | Payer: Self-pay | Source: Home / Self Care | Attending: Internal Medicine

## 2023-12-24 ENCOUNTER — Other Ambulatory Visit: Payer: Self-pay

## 2023-12-24 DIAGNOSIS — Z79899 Other long term (current) drug therapy: Secondary | ICD-10-CM | POA: Insufficient documentation

## 2023-12-24 DIAGNOSIS — Z87891 Personal history of nicotine dependence: Secondary | ICD-10-CM | POA: Insufficient documentation

## 2023-12-24 DIAGNOSIS — K219 Gastro-esophageal reflux disease without esophagitis: Secondary | ICD-10-CM | POA: Insufficient documentation

## 2023-12-24 DIAGNOSIS — K317 Polyp of stomach and duodenum: Secondary | ICD-10-CM

## 2023-12-24 DIAGNOSIS — K30 Functional dyspepsia: Secondary | ICD-10-CM | POA: Diagnosis not present

## 2023-12-24 DIAGNOSIS — I10 Essential (primary) hypertension: Secondary | ICD-10-CM | POA: Diagnosis not present

## 2023-12-24 DIAGNOSIS — K8020 Calculus of gallbladder without cholecystitis without obstruction: Secondary | ICD-10-CM | POA: Insufficient documentation

## 2023-12-24 HISTORY — PX: ESOPHAGOGASTRODUODENOSCOPY: SHX5428

## 2023-12-24 SURGERY — EGD (ESOPHAGOGASTRODUODENOSCOPY)
Anesthesia: General

## 2023-12-24 MED ORDER — LACTATED RINGERS IV SOLN: INTRAVENOUS | Status: DC

## 2023-12-24 MED ORDER — LIDOCAINE 2% (20 MG/ML) 5 ML SYRINGE
INTRAMUSCULAR | Status: DC | PRN
  Administered 2023-12-24: 100 mg via INTRAVENOUS

## 2023-12-24 MED ORDER — PROPOFOL 10 MG/ML IV BOLUS
INTRAVENOUS | Status: DC | PRN
  Administered 2023-12-24: 100 mg via INTRAVENOUS

## 2023-12-24 MED ORDER — PROPOFOL 500 MG/50ML IV EMUL
INTRAVENOUS | Status: DC | PRN
  Administered 2023-12-24: 100 ug/kg/min via INTRAVENOUS

## 2023-12-24 MED ORDER — LACTATED RINGERS IV SOLN: INTRAVENOUS | Status: DC | PRN

## 2023-12-24 NOTE — Transfer of Care (Signed)
 Immediate Anesthesia Transfer of Care Note  Patient: Linda Stein  Procedure(s) Performed: EGD (ESOPHAGOGASTRODUODENOSCOPY)  Patient Location: Endoscopy Unit  Anesthesia Type:General  Level of Consciousness: awake  Airway & Oxygen Therapy: Patient Spontanous Breathing  Post-op Assessment: Report given to RN and Post -op Vital signs reviewed and stable  Post vital signs: Reviewed and stable  Last Vitals:  Vitals Value Taken Time  BP    Temp    Pulse    Resp    SpO2      Last Pain:  Vitals:   12/24/23 1241  TempSrc:   PainSc: 0-No pain      Patients Stated Pain Goal: 3 (12/24/23 1153)  Complications: No notable events documented.

## 2023-12-24 NOTE — Anesthesia Procedure Notes (Signed)
 Date/Time: 12/24/2023 12:39 PM  Performed by: Sherwin Donate, CRNAPre-anesthesia Checklist: Patient identified, Emergency Drugs available, Suction available and Patient being monitored Patient Re-evaluated:Patient Re-evaluated prior to induction Oxygen Delivery Method: Nasal cannula Induction Type: IV induction Placement Confirmation: positive ETCO2 Comments: Optiflow High Flow [place] O2 used.

## 2023-12-24 NOTE — Discharge Instructions (Addendum)
 EGD Discharge instructions Please read the instructions outlined below and refer to this sheet in the next few weeks. These discharge instructions provide you with general information on caring for yourself after you leave the hospital. Your doctor may also give you specific instructions. While your treatment has been planned according to the most current medical practices available, unavoidable complications occasionally occur. If you have any problems or questions after discharge, please call your doctor. ACTIVITY You may resume your regular activity but move at a slower pace for the next 24 hours.  Take frequent rest periods for the next 24 hours.  Walking will help expel (get rid of) the air and reduce the bloated feeling in your abdomen.  No driving for 24 hours (because of the anesthesia (medicine) used during the test).  You may shower.  Do not sign any important legal documents or operate any machinery for 24 hours (because of the anesthesia used during the test).  NUTRITION Drink plenty of fluids.  You may resume your normal diet.  Begin with a light meal and progress to your normal diet.  Avoid alcoholic beverages for 24 hours or as instructed by your caregiver.  MEDICATIONS You may resume your normal medications unless your caregiver tells you otherwise.  WHAT YOU CAN EXPECT TODAY You may experience abdominal discomfort such as a feeling of fullness or "gas" pains.  FOLLOW-UP Your doctor will discuss the results of your test with you.  SEEK IMMEDIATE MEDICAL ATTENTION IF ANY OF THE FOLLOWING OCCUR: Excessive nausea (feeling sick to your stomach) and/or vomiting.  Severe abdominal pain and distention (swelling).  Trouble swallowing.  Temperature over 101 F (37.8 C).  Rectal bleeding or vomiting of blood.    Your stomach was slightly inflamed.  Biopsies taken.  1 polyp removed  Stop pantoprazole /omeprazole   Lets try new medication called Voquenza a  20 mg pill.  Go to my  office.  Free samples are waiting for you at the front desk.  Take 1 daily for the next 2 weeks and see how that works for your symptoms it controls acid by another mechanism different than Protonix  or omeprazole   Further recommendations to follow pending review of pathology report  Office visit with Azalee Lenz in 1 month OFFICE WILL CALL WITH APPOINTMENT

## 2023-12-24 NOTE — H&P (Signed)
 @LOGO @   Primary Care Physician:  Lauran Pollard, MD Primary Gastroenterologist:  Dr. Riley Cheadle  Pre-Procedure History & Physical: HPI:  Linda Stein is a 89 y.o. female here for evaluation of dyspepsia and heartburn.  Seems to have failed medical treatment.  2 cm gallstone in the gallbladder.  No dysphagia.  Past Medical History:  Diagnosis Date   Atrophic vaginitis    Diarrhea    GERD (gastroesophageal reflux disease)    History of kidney stones    Irritable bowel syndrome     Past Surgical History:  Procedure Laterality Date   BIOPSY  10/16/2021   Procedure: BIOPSY;  Surgeon: Vinetta Greening, DO;  Location: [facility];  Service: Endoscopy;;   Cataract surgery  2021   CESAREAN SECTION  1964   ESOPHAGOGASTRODUODENOSCOPY (EGD) WITH PROPOFOL  N/A 10/16/2021   Procedure: ESOPHAGOGASTRODUODENOSCOPY (EGD) WITH PROPOFOL ;  Surgeon: Vinetta Greening, DO;  [facility]: [facility];  Service: Endoscopy;  Laterality: N/A;  11:30am   NEPHROLITHOTOMY Right 03/20/2021   Procedure: FIRST STAGE RIGHT NEPHROLITHOTOMY PERCUTANEOUS WITH  SURGEON ACCESS CYSTOSCOPY RIGHT RETROGRADE;  Surgeon: Osborn Blaze, MD;  Location: [facility];  Service: Urology;  Laterality: Right;   NEPHROLITHOTOMY Right 03/22/2021   Procedure: NEPHROLITHOTOMY PERCUTANEOUS SECOND LOOK, URETERAL STENT PLACEMENT,LEFT;  Surgeon: Osborn Blaze, MD;  Location: [facility];  Service: Urology;  Laterality: Right;    Prior to Admission medications   Medication Sig Start Date End Date Taking? Authorizing Provider  Cholecalciferol (VITAMIN D3 PO) Take 5,000 Units by mouth in the morning.   Yes [provider]  omeprazole  (PRILOSEC) 20 MG capsule Take 1 capsule (20 mg total) by mouth daily before breakfast. 11/13/23  Yes Lanney Pitts, PA-C  pantoprazole  (PROTONIX ) 40 MG tablet Take 40 mg by mouth 2 (two) times daily. 11/14/23  Yes [provider]  predniSONE  (DELTASONE ) 1 MG tablet Take 4 mg by mouth daily with  breakfast.   Yes [provider]  Probiotic Product (PHILLIPS COLON HEALTH PO) Take 1 capsule by mouth daily.   Yes [provider]  sucralfate  (CARAFATE ) 1 GM/10ML suspension Take 10 mLs (1 g total) by mouth 4 (four) times daily -  before meals and at bedtime. 11/25/23  Yes Lanney Pitts, PA-C    Allergies as of 12/15/2023 - Review Complete 11/18/2023  Allergen Reaction Noted   Plaquenil [hydroxychloroquine] Rash 01/23/2023   Sulfa antibiotics Other (See Comments) 06/07/2014    Family History  Problem Relation Age of Onset   Other Mother        aneursym   Stroke Mother    Cancer Father    Other Son        hip replacement   Colon cancer Neg Hx     Social History   Socioeconomic History   Marital status: Widowed    Spouse name: Not on file   Number of children: Not on file   Years of education: Not on file   Highest education level: Not on file  Occupational History   Not on file  Tobacco Use   Smoking status: Former    Types: Cigarettes   Smokeless tobacco: Never  Vaping Use   Vaping status: Never Used  Substance and Sexual Activity   Alcohol use: No   Drug use: No   Sexual activity: Not Currently    Birth control/protection: Post-menopausal  Other Topics Concern   Not on file  Social History Narrative   Not on file   Social  Drivers of Corporate investment banker Strain: Not on file  Food Insecurity: Not on file  Transportation Needs: Not on file  Physical Activity: Not on file  Stress: Not on file  Social Connections: Not on file  Intimate Partner Violence: Not on file    Review of Systems: See HPI, otherwise negative ROS  Physical Exam: BP (!) 166/60   Pulse 77   Temp 97.7 F (36.5 C) (Oral)   Resp 12   Ht 5\' 2"  (1.575 m)   Wt 55.8 kg   SpO2 98%   BMI 22.50 kg/m  General:   Alert,  Well-developed, well-nourished, pleasant and cooperative in NAD Lungs:  Clear throughout to auscultation.   No wheezes, crackles, or rhonchi.  No acute distress. Heart:  Regular rate and rhythm; no murmurs, clicks, rubs,  or gallops. Abdomen: Non-distended, normal bowel sounds.  Soft and nontender without appreciable mass or hepatosplenomegaly.  Pulses:  Normal pulses noted. Extremities:  Without clubbing or edema.  Impression/Plan: 89 year old lady with persisting reflux/dyspeptic symptoms now belching refractory to PPI therapy to wave omeprazole .  No dysphagia.  2 cm gallstone in the gallbladder without signs of cholecystitis.  EGD today to further evaluate.  The risks, benefits, limitations, alternatives and imponderables have been reviewed with the patient. Potential for esophageal dilation, biopsy, etc. have also been reviewed.  Questions have been answered. All parties agreeable.      Notice: This dictation was prepared with Dragon dictation along with smaller phrase technology. Any transcriptional errors that result from this process are unintentional and may not be corrected upon review.

## 2023-12-24 NOTE — Op Note (Signed)
 [facility] Patient Name: Linda Stein Procedure Date: 12/24/2023 11:44 AM MRN: [id number] Date of Birth: 12-05-1933 Attending MD: Gemma Kelp , MD, [id number] CSN: [id number] Age: 89 Admit Type: Outpatient Procedure:                Upper GI endoscopy Indications:              Dyspepsia; no improvement with 2 different PPIs Providers:                Gemma Kelp, MD, Crystal Page, [place]                            Wilson, Technician, Theola Fitch Referring MD:             Janita Mellow Medicines:                Propofol  per Anesthesia Complications:            No immediate complications. Estimated Blood Loss:     Estimated blood loss was minimal. Procedure:                Pre-Anesthesia Assessment:                           - Prior to the procedure, a History and Physical                            was performed, and patient medications and                            allergies were reviewed. The patient's tolerance of                            previous anesthesia was also reviewed. The risks                            and benefits of the procedure and the sedation                            options and risks were discussed with the patient.                            All questions were answered, and informed consent                            was obtained. Prior Anticoagulants: The patient has                            taken no anticoagulant or antiplatelet agents. ASA                            Grade Assessment: III - A patient with severe                            systemic disease. After reviewing the risks and  benefits, the patient was deemed in satisfactory                            condition to undergo the procedure.                           After obtaining informed consent, the endoscope was                            passed under direct vision. Throughout the                            procedure, the patient's blood pressure,  pulse, and                            oxygen saturations were monitored continuously. The                            [id number] ([id number]) scope was introduced through the                            mouth, and advanced to the second part of duodenum.                            The upper GI endoscopy was accomplished without                            difficulty. The patient tolerated the procedure                            well. Scope In: 12:45:02 PM Scope Out: 12:48:28 PM Total Procedure Duration: 0 hours 3 minutes 26 seconds  Findings:      The examined esophagus was normal. Stomach empty. Scattered fundic gland       appearing polyps largest 8 mm in size. No ulcer or infiltrating process.       Pylorus patent      The duodenal bulb and second portion of the duodenum were normal. The       largest 8 mm polyp seen was cold biopsy removed. Also biopsies of the       antrum and gastric body were taken to assess for H. pylori. Impression:               - Normal esophagus. Gastric polyp?"removed. Status                            post gastric biopsy                           - Normal duodenal bulb and second portion of the                            duodenum.                           - Findings today not very impressive. She may be  a                            PPI nonresponder and may have an element of GERD.                            Alternatively, she may have atypical biliary                            symptoms; she does have a large gallstone in her                            gallbladder; functional dyspepsia also a                            consideration. Moderate Sedation:      Moderate (conscious) sedation was personally administered by an       anesthesia professional. The following parameters were monitored: oxygen       saturation, heart rate, blood pressure, respiratory rate, EKG, adequacy       of pulmonary ventilation, and response to care. Recommendation:           -  Patient has a contact number available for                            emergencies. The signs and symptoms of potential                            delayed complications were discussed with the                            patient. Return to normal activities tomorrow.                            Written discharge instructions were provided to the                            patient.                           - Advance diet as tolerated. Stop Protonix  and                            omeprazole . 2-week trial of Voquenza 20 mg tablet 1                            daily. Samples x 14 days to be picked up from the                            office.                           - Follow-up with us  in 1 month. May need further  gallbladder evaluation query HIDA with fatty meal                            challenge. Follow-up on pathology Procedure Code(s):        --- Professional ---                           [phone], Esophagogastroduodenoscopy, flexible,                            transoral; diagnostic, including collection of                            specimen(s) by brushing or washing, when performed                            (separate procedure) Diagnosis Code(s):        --- Professional ---                           R10.13, Epigastric pain CPT copyright 2022 American Medical Association. All rights reserved. The codes documented in this report are preliminary and upon coder review may  be revised to meet current compliance requirements. Linda Stein. Linda Wahler, MD Gemma Kelp, MD 12/24/2023 1:02:10 PM This report has been signed electronically. Number of Addenda: 0

## 2023-12-24 NOTE — Anesthesia Preprocedure Evaluation (Signed)
Anesthesia Evaluation  Patient identified by MRN, date of birth, ID band Patient awake    Reviewed: Allergy & Precautions, H&P , NPO status , Patient's Chart, lab work & pertinent test results, reviewed documented beta blocker date and time   Airway Mallampati: II  TM Distance: >3 FB Neck ROM: full    Dental no notable dental hx.    Pulmonary neg pulmonary ROS, former smoker   Pulmonary exam normal breath sounds clear to auscultation       Cardiovascular Exercise Tolerance: Good hypertension, negative cardio ROS  Rhythm:regular Rate:Normal     Neuro/Psych negative neurological ROS  negative psych ROS   GI/Hepatic negative GI ROS, Neg liver ROS,GERD  ,,  Endo/Other  negative endocrine ROS    Renal/GU Renal diseasenegative Renal ROS  negative genitourinary   Musculoskeletal   Abdominal   Peds  Hematology negative hematology ROS (+)   Anesthesia Other Findings   Reproductive/Obstetrics negative OB ROS                             Anesthesia Physical Anesthesia Plan  ASA: 2  Anesthesia Plan: General   Post-op Pain Management:    Induction:   PONV Risk Score and Plan: Propofol infusion  Airway Management Planned:   Additional Equipment:   Intra-op Plan:   Post-operative Plan:   Informed Consent: I have reviewed the patients History and Physical, chart, labs and discussed the procedure including the risks, benefits and alternatives for the proposed anesthesia with the patient or authorized representative who has indicated his/her understanding and acceptance.     Dental Advisory Given  Plan Discussed with: CRNA  Anesthesia Plan Comments:        Anesthesia Quick Evaluation

## 2023-12-25 LAB — SURGICAL PATHOLOGY

## 2023-12-25 NOTE — Anesthesia Postprocedure Evaluation (Signed)
 Anesthesia Post Note  Patient: Linda Stein  Procedure(s) Performed: EGD (ESOPHAGOGASTRODUODENOSCOPY)  Patient location during evaluation: Phase II Anesthesia Type: General Level of consciousness: awake Pain management: pain level controlled Vital Signs Assessment: post-procedure vital signs reviewed and stable Respiratory status: spontaneous breathing and respiratory function stable Cardiovascular status: blood pressure returned to baseline and stable Postop Assessment: no headache and no apparent nausea or vomiting Anesthetic complications: no Comments: Late entry   No notable events documented.   Last Vitals:  Vitals:   12/24/23 1153 12/24/23 1254  BP: (!) 166/60 (!) 120/52  Pulse: 77 76  Resp: 12 19  Temp: 36.5 C 36.8 C  SpO2: 98% 99%    Last Pain:  Vitals:   12/24/23 1254  TempSrc: Oral  PainSc: 0-No pain                 Coretha Dew

## 2023-12-28 ENCOUNTER — Ambulatory Visit: Payer: Self-pay | Admitting: Internal Medicine

## 2023-12-29 ENCOUNTER — Encounter (HOSPITAL_COMMUNITY): Payer: Self-pay | Admitting: Internal Medicine

## 2024-01-11 DIAGNOSIS — N200 Calculus of kidney: Secondary | ICD-10-CM | POA: Diagnosis not present

## 2024-01-11 DIAGNOSIS — K080 Exfoliation of teeth due to systemic causes: Secondary | ICD-10-CM | POA: Diagnosis not present

## 2024-01-11 DIAGNOSIS — R34 Anuria and oliguria: Secondary | ICD-10-CM | POA: Diagnosis not present

## 2024-01-19 ENCOUNTER — Ambulatory Visit: Admitting: Internal Medicine

## 2024-01-19 ENCOUNTER — Encounter: Payer: Self-pay | Admitting: Internal Medicine

## 2024-01-19 ENCOUNTER — Other Ambulatory Visit: Payer: Self-pay

## 2024-01-19 VITALS — BP 144/72 | HR 74 | Temp 97.6°F | Ht 62.0 in | Wt 124.2 lb

## 2024-01-19 DIAGNOSIS — M5416 Radiculopathy, lumbar region: Secondary | ICD-10-CM | POA: Diagnosis not present

## 2024-01-19 DIAGNOSIS — K219 Gastro-esophageal reflux disease without esophagitis: Secondary | ICD-10-CM | POA: Diagnosis not present

## 2024-01-19 DIAGNOSIS — M064 Inflammatory polyarthropathy: Secondary | ICD-10-CM | POA: Diagnosis not present

## 2024-01-19 DIAGNOSIS — M353 Polymyalgia rheumatica: Secondary | ICD-10-CM | POA: Diagnosis not present

## 2024-01-19 DIAGNOSIS — M1991 Primary osteoarthritis, unspecified site: Secondary | ICD-10-CM | POA: Diagnosis not present

## 2024-01-19 MED ORDER — VOQUEZNA 20 MG PO TABS: 20.0000 mg | ORAL_TABLET | Freq: Every day | ORAL | 11 refills | Status: DC

## 2024-01-19 NOTE — Progress Notes (Unsigned)
 Primary Care Physician:  Lauran Pollard, MD Primary Gastroenterologist:  Dr. Riley Cheadle  Pre-Procedure History & Physical: HPI:  Linda Stein is a 89 y.o. female here for follow-up of GERD.  She has failed 2 PPIs previously now doing extremely well on Voqesna 20 mg daily no dysphagia.  Very pleased with improvement in her GI symptoms.  She remains a very active 89 year old who enjoys fairly good health ultrasound demonstrated a right nonobstructing kidney stone and a 2 cm gallstone neither of which is felt to be causing any problems at this time.  She states she had a colonoscopy many many years ago.  No bowel symptoms at this time.  Right pelvic kidney stone and 2 cm gallstone both asymptomatic and on ultrasound  Past Medical History:  Diagnosis Date   Atrophic vaginitis    Diarrhea    GERD (gastroesophageal reflux disease)    History of kidney stones    Irritable bowel syndrome     Past Surgical History:  Procedure Laterality Date   BIOPSY  10/16/2021   Procedure: BIOPSY;  Surgeon: Vinetta Greening, DO;  [facility]: [facility];  Service: Endoscopy;;   Cataract surgery  2021   CESAREAN SECTION  1964   ESOPHAGOGASTRODUODENOSCOPY N/A 12/24/2023   Procedure: EGD (ESOPHAGOGASTRODUODENOSCOPY);  Surgeon: Suzette Espy, MD;  [facility]: [facility];  Service: Endoscopy;  Laterality: N/A;  1045AM, ASA 2   ESOPHAGOGASTRODUODENOSCOPY (EGD) WITH PROPOFOL  N/A 10/16/2021   Procedure: ESOPHAGOGASTRODUODENOSCOPY (EGD) WITH PROPOFOL ;  Surgeon: Vinetta Greening, DO;  Location: [facility];  Service: Endoscopy;  Laterality: N/A;  11:30am   NEPHROLITHOTOMY Right 03/20/2021   Procedure: FIRST STAGE RIGHT NEPHROLITHOTOMY PERCUTANEOUS WITH  SURGEON ACCESS CYSTOSCOPY RIGHT RETROGRADE;  Surgeon: Osborn Blaze, MD;  Location: [facility];  Service: Urology;  Laterality: Right;   NEPHROLITHOTOMY Right 03/22/2021   Procedure: NEPHROLITHOTOMY PERCUTANEOUS SECOND LOOK, URETERAL STENT PLACEMENT,LEFT;   Surgeon: Osborn Blaze, MD;  Location: [facility];  Service: Urology;  Laterality: Right;    Prior to Admission medications   Medication Sig Start Date End Date Taking? Authorizing Provider  Cholecalciferol (VITAMIN D3 PO) Take 5,000 Units by mouth in the morning.   Yes [provider]  predniSONE  (DELTASONE ) 1 MG tablet Take 4 mg by mouth daily with breakfast.   Yes [provider]  Probiotic Product (PHILLIPS COLON HEALTH PO) Take 1 capsule by mouth daily.   Yes [provider]  Vonoprazan Fumarate (VOQUEZNA) 20 MG TABS Take 20 mg by mouth daily.   Yes [provider]    Allergies as of 01/19/2024 - Review Complete 01/19/2024  Allergen Reaction Noted   Plaquenil [hydroxychloroquine] Rash 01/23/2023   Sulfa antibiotics Other (See Comments) 06/07/2014    Family History  Problem Relation Age of Onset   Other Mother        aneursym   Stroke Mother    Cancer Father    Other Son        hip replacement   Colon cancer Neg Hx     Social History   Socioeconomic History   Marital status: Widowed    Spouse name: Not on file   Number of children: Not on file   Years of education: Not on file   Highest education level: Not on file  Occupational History   Not on file  Tobacco Use   Smoking status: Former    Types: Cigarettes   Smokeless tobacco: Never  Vaping Use   Vaping status: Never  Used  Substance and Sexual Activity   Alcohol use: No   Drug use: No   Sexual activity: Not Currently    Birth control/protection: Post-menopausal  Other Topics Concern   Not on file  Social History Narrative   Not on file   Social Drivers of Health   Financial Resource Strain: Not on file  Food Insecurity: Not on file  Transportation Needs: Not on file  Physical Activity: Not on file  Stress: Not on file  Social Connections: Not on file  Intimate Partner Violence: Not on file    Review of Systems: See HPI, otherwise negative ROS  Physical  Exam: BP (!) 144/72 (BP Location: Left Arm, Patient Position: Sitting, Cuff Size: Normal)   Pulse 74   Temp 97.6 F (36.4 C) (Oral)   Ht 5\' 2"  (1.575 m)   Wt 124 lb 3.2 oz (56.3 kg)   SpO2 97%   BMI 22.72 kg/m  General:   Alert,  Well-developed, well-nourished, pleasant and cooperative in NAD Abdomen: Non-distended, normal bowel sounds.  Soft and nontender without appreciable mass or hepatosplenomegaly.   Impression/Plan: This 89 year old lady with uncomplicated GERD refractory to 2 different PPI trials.  Now on Voqesna and doing extremely well.  She is very happy.  She likely is in the 7% of the population and do not respond to PPI agents.  Recommendations:  Continue Voquesna.  Hopefully her insurance company will provide a reasonable benefit  Additional samples today  Office visit in 6 months.     Notice: This dictation was prepared with Dragon dictation along with smaller phrase technology. Any transcriptional errors that result from this process are unintentional and may not be corrected upon review.

## 2024-01-19 NOTE — Patient Instructions (Signed)
 It was good to see you again today!  Glad the Voquenza is working for you  We will give you additional samples as they are available  Voquenza 20 mg pill dispense 30 with 11 refills.  As discussed, you do have a kidney stone and a gallstone.  Neither are causing any problems.  Office visit with me in 6 months

## 2024-01-25 ENCOUNTER — Ambulatory Visit: Admitting: Gastroenterology

## 2024-01-26 DIAGNOSIS — M9902 Segmental and somatic dysfunction of thoracic region: Secondary | ICD-10-CM | POA: Diagnosis not present

## 2024-01-26 DIAGNOSIS — M542 Cervicalgia: Secondary | ICD-10-CM | POA: Diagnosis not present

## 2024-01-26 DIAGNOSIS — M9901 Segmental and somatic dysfunction of cervical region: Secondary | ICD-10-CM | POA: Diagnosis not present

## 2024-01-26 DIAGNOSIS — M546 Pain in thoracic spine: Secondary | ICD-10-CM | POA: Diagnosis not present

## 2024-02-01 DIAGNOSIS — M542 Cervicalgia: Secondary | ICD-10-CM | POA: Diagnosis not present

## 2024-02-01 DIAGNOSIS — M546 Pain in thoracic spine: Secondary | ICD-10-CM | POA: Diagnosis not present

## 2024-02-01 DIAGNOSIS — M9901 Segmental and somatic dysfunction of cervical region: Secondary | ICD-10-CM | POA: Diagnosis not present

## 2024-02-01 DIAGNOSIS — M9902 Segmental and somatic dysfunction of thoracic region: Secondary | ICD-10-CM | POA: Diagnosis not present

## 2024-02-01 DIAGNOSIS — K080 Exfoliation of teeth due to systemic causes: Secondary | ICD-10-CM | POA: Diagnosis not present

## 2024-02-10 DIAGNOSIS — M542 Cervicalgia: Secondary | ICD-10-CM | POA: Diagnosis not present

## 2024-02-10 DIAGNOSIS — M9901 Segmental and somatic dysfunction of cervical region: Secondary | ICD-10-CM | POA: Diagnosis not present

## 2024-02-10 DIAGNOSIS — M546 Pain in thoracic spine: Secondary | ICD-10-CM | POA: Diagnosis not present

## 2024-02-10 DIAGNOSIS — M9902 Segmental and somatic dysfunction of thoracic region: Secondary | ICD-10-CM | POA: Diagnosis not present

## 2024-02-15 DIAGNOSIS — C44319 Basal cell carcinoma of skin of other parts of face: Secondary | ICD-10-CM | POA: Diagnosis not present

## 2024-02-15 DIAGNOSIS — L821 Other seborrheic keratosis: Secondary | ICD-10-CM | POA: Diagnosis not present

## 2024-02-23 ENCOUNTER — Encounter: Payer: Self-pay | Admitting: Podiatry

## 2024-02-23 ENCOUNTER — Ambulatory Visit: Admitting: Podiatry

## 2024-02-23 DIAGNOSIS — D2372 Other benign neoplasm of skin of left lower limb, including hip: Secondary | ICD-10-CM | POA: Diagnosis not present

## 2024-02-23 DIAGNOSIS — D2371 Other benign neoplasm of skin of right lower limb, including hip: Secondary | ICD-10-CM | POA: Diagnosis not present

## 2024-02-23 NOTE — Progress Notes (Signed)
 She presents today was here just 1 month ago to see Dr. Allena Katz for her calluses.  States that they came back very quickly.  Objective: Vital signs are stable alert and oriented x 3 there is no erythema edema cellulitis drainage or odor punctated benign skin lesions bilateral foot.  Painful on palpation.  Assessment: Pain limb secondary to benign skin lesions left and right foot.  Plan: Debridement of benign skin lesions bilateral placed padding.  Follow-up in 3 months

## 2024-02-24 DIAGNOSIS — M9901 Segmental and somatic dysfunction of cervical region: Secondary | ICD-10-CM | POA: Diagnosis not present

## 2024-02-24 DIAGNOSIS — M9902 Segmental and somatic dysfunction of thoracic region: Secondary | ICD-10-CM | POA: Diagnosis not present

## 2024-02-24 DIAGNOSIS — M542 Cervicalgia: Secondary | ICD-10-CM | POA: Diagnosis not present

## 2024-02-24 DIAGNOSIS — M546 Pain in thoracic spine: Secondary | ICD-10-CM | POA: Diagnosis not present

## 2024-03-14 DIAGNOSIS — M546 Pain in thoracic spine: Secondary | ICD-10-CM | POA: Diagnosis not present

## 2024-03-14 DIAGNOSIS — M9901 Segmental and somatic dysfunction of cervical region: Secondary | ICD-10-CM | POA: Diagnosis not present

## 2024-03-14 DIAGNOSIS — M9902 Segmental and somatic dysfunction of thoracic region: Secondary | ICD-10-CM | POA: Diagnosis not present

## 2024-03-14 DIAGNOSIS — M542 Cervicalgia: Secondary | ICD-10-CM | POA: Diagnosis not present

## 2024-03-15 DIAGNOSIS — M545 Low back pain, unspecified: Secondary | ICD-10-CM | POA: Diagnosis not present

## 2024-03-16 DIAGNOSIS — K219 Gastro-esophageal reflux disease without esophagitis: Secondary | ICD-10-CM | POA: Diagnosis not present

## 2024-03-16 DIAGNOSIS — Z1331 Encounter for screening for depression: Secondary | ICD-10-CM | POA: Diagnosis not present

## 2024-03-16 DIAGNOSIS — Z1389 Encounter for screening for other disorder: Secondary | ICD-10-CM | POA: Diagnosis not present

## 2024-03-16 DIAGNOSIS — E559 Vitamin D deficiency, unspecified: Secondary | ICD-10-CM | POA: Diagnosis not present

## 2024-03-16 DIAGNOSIS — E119 Type 2 diabetes mellitus without complications: Secondary | ICD-10-CM | POA: Diagnosis not present

## 2024-03-16 DIAGNOSIS — Z13220 Encounter for screening for lipoid disorders: Secondary | ICD-10-CM | POA: Diagnosis not present

## 2024-03-16 DIAGNOSIS — M1990 Unspecified osteoarthritis, unspecified site: Secondary | ICD-10-CM | POA: Diagnosis not present

## 2024-03-16 DIAGNOSIS — E039 Hypothyroidism, unspecified: Secondary | ICD-10-CM | POA: Diagnosis not present

## 2024-03-16 DIAGNOSIS — Z6822 Body mass index (BMI) 22.0-22.9, adult: Secondary | ICD-10-CM | POA: Diagnosis not present

## 2024-03-16 DIAGNOSIS — Z0001 Encounter for general adult medical examination with abnormal findings: Secondary | ICD-10-CM | POA: Diagnosis not present

## 2024-03-21 DIAGNOSIS — Z85828 Personal history of other malignant neoplasm of skin: Secondary | ICD-10-CM | POA: Diagnosis not present

## 2024-03-21 DIAGNOSIS — Z08 Encounter for follow-up examination after completed treatment for malignant neoplasm: Secondary | ICD-10-CM | POA: Diagnosis not present

## 2024-03-22 DIAGNOSIS — M5416 Radiculopathy, lumbar region: Secondary | ICD-10-CM | POA: Diagnosis not present

## 2024-03-22 DIAGNOSIS — M353 Polymyalgia rheumatica: Secondary | ICD-10-CM | POA: Diagnosis not present

## 2024-03-22 DIAGNOSIS — M064 Inflammatory polyarthropathy: Secondary | ICD-10-CM | POA: Diagnosis not present

## 2024-03-22 DIAGNOSIS — M1991 Primary osteoarthritis, unspecified site: Secondary | ICD-10-CM | POA: Diagnosis not present

## 2024-04-13 ENCOUNTER — Other Ambulatory Visit: Payer: Self-pay

## 2024-04-13 MED ORDER — VOQUEZNA 20 MG PO TABS: 20.0000 mg | ORAL_TABLET | Freq: Every day | ORAL | 11 refills | Status: DC

## 2024-04-18 ENCOUNTER — Telehealth: Payer: Self-pay

## 2024-04-18 DIAGNOSIS — B078 Other viral warts: Secondary | ICD-10-CM | POA: Diagnosis not present

## 2024-04-18 MED ORDER — VOQUEZNA 20 MG PO TABS: 20.0000 mg | ORAL_TABLET | Freq: Every day | ORAL | Status: DC

## 2024-04-18 NOTE — Telephone Encounter (Signed)
 Medication Samples have been provided to the patient.  Drug name: Voquezna        Strength: 20mg         Qty: 2  LOT: [id number]  Exp.Date: 06/2024  Dosing instructions: Take one tablet once daily  The patient has been instructed regarding the correct time, dose, and frequency of taking this medication, including desired effects and most common side effects.   Nekoda Chock M Calix Heinbaugh 1:21 PM 04/18/2024

## 2024-04-20 DIAGNOSIS — K080 Exfoliation of teeth due to systemic causes: Secondary | ICD-10-CM | POA: Diagnosis not present

## 2024-04-25 DIAGNOSIS — B078 Other viral warts: Secondary | ICD-10-CM | POA: Diagnosis not present

## 2024-05-11 ENCOUNTER — Other Ambulatory Visit: Payer: Self-pay

## 2024-05-11 MED ORDER — VOQUEZNA 20 MG PO TABS: 20.0000 mg | ORAL_TABLET | Freq: Every day | ORAL | Status: DC

## 2024-05-11 NOTE — Progress Notes (Signed)
 Medication Samples have been provided to the patient.  Drug name: Voquezna        Strength: 20 mg        Qty: 6  LOT: [id number]  Exp.Date: 06/10/2025  Dosing instructions: Take one tablet daily 30 mins before breakfast  The patient has been instructed regarding the correct time, dose, and frequency of taking this medication, including desired effects and most common side effects.   Linda Stein 8:53 AM 05/11/2024

## 2024-05-12 ENCOUNTER — Ambulatory Visit: Admitting: Podiatry

## 2024-05-24 ENCOUNTER — Ambulatory Visit: Admitting: Podiatry

## 2024-05-24 ENCOUNTER — Encounter: Payer: Self-pay | Admitting: Podiatry

## 2024-05-24 DIAGNOSIS — D2372 Other benign neoplasm of skin of left lower limb, including hip: Secondary | ICD-10-CM | POA: Diagnosis not present

## 2024-05-24 DIAGNOSIS — M542 Cervicalgia: Secondary | ICD-10-CM | POA: Insufficient documentation

## 2024-05-24 DIAGNOSIS — M1991 Primary osteoarthritis, unspecified site: Secondary | ICD-10-CM | POA: Insufficient documentation

## 2024-05-24 DIAGNOSIS — Z7952 Long term (current) use of systemic steroids: Secondary | ICD-10-CM | POA: Insufficient documentation

## 2024-05-24 DIAGNOSIS — D2371 Other benign neoplasm of skin of right lower limb, including hip: Secondary | ICD-10-CM

## 2024-05-24 DIAGNOSIS — R21 Rash and other nonspecific skin eruption: Secondary | ICD-10-CM | POA: Insufficient documentation

## 2024-05-24 DIAGNOSIS — M064 Inflammatory polyarthropathy: Secondary | ICD-10-CM | POA: Insufficient documentation

## 2024-05-24 DIAGNOSIS — M810 Age-related osteoporosis without current pathological fracture: Secondary | ICD-10-CM | POA: Insufficient documentation

## 2024-05-24 DIAGNOSIS — M2550 Pain in unspecified joint: Secondary | ICD-10-CM | POA: Insufficient documentation

## 2024-05-24 DIAGNOSIS — M1990 Unspecified osteoarthritis, unspecified site: Secondary | ICD-10-CM | POA: Insufficient documentation

## 2024-05-24 NOTE — Progress Notes (Signed)
 She presents today chief complaint of painful calluses bilaterally.  Objective: Vital signs are stable and oriented x 3.  She has a painful lesion plantar aspect of the fourth metatarsal head of the right foot.  She also has painful lesions beneath the fifth metatarsal head of the left foot.  Assessment: Benign skin lesions bilaterally right greater than left.  Plan: Mechanical debridement followed by chemical debridement left under occlusion with Salinocaine and for 3 days.  Follow-up with her in 3 months

## 2024-06-27 DIAGNOSIS — M353 Polymyalgia rheumatica: Secondary | ICD-10-CM | POA: Diagnosis not present

## 2024-06-27 DIAGNOSIS — Z7952 Long term (current) use of systemic steroids: Secondary | ICD-10-CM | POA: Diagnosis not present

## 2024-06-27 DIAGNOSIS — M1991 Primary osteoarthritis, unspecified site: Secondary | ICD-10-CM | POA: Diagnosis not present

## 2024-06-27 DIAGNOSIS — M064 Inflammatory polyarthropathy: Secondary | ICD-10-CM | POA: Diagnosis not present

## 2024-06-28 ENCOUNTER — Other Ambulatory Visit: Payer: Self-pay

## 2024-06-28 MED ORDER — VOQUEZNA 20 MG PO TABS: 20.0000 mg | ORAL_TABLET | Freq: Every day | ORAL | Status: DC

## 2024-06-28 NOTE — Progress Notes (Signed)
 Medication Samples have been provided to the patient.  Drug name: Voquezna        Strength: 20 mg        Qty: 4  LOT: [id number]  Exp.Date: 06/10/2025  Dosing instructions: Take one tablet daily  The patient has been instructed regarding the correct time, dose, and frequency of taking this medication, including desired effects and most common side effects.   Linda Stein 9:59 AM 06/28/2024

## 2024-07-05 ENCOUNTER — Encounter: Payer: Self-pay | Admitting: Internal Medicine

## 2024-07-26 DIAGNOSIS — K080 Exfoliation of teeth due to systemic causes: Secondary | ICD-10-CM | POA: Diagnosis not present

## 2024-08-09 ENCOUNTER — Other Ambulatory Visit: Payer: Self-pay

## 2024-08-09 MED ORDER — VOQUEZNA 20 MG PO TABS: 20.0000 mg | ORAL_TABLET | Freq: Every day | ORAL | Status: DC

## 2024-08-09 NOTE — Progress Notes (Signed)
 Medication Samples have been provided to the patient.  Drug name: Voquezna        Strength: 20 mg        Qty: 4  LOT: [id number]  Exp.Date: 06/10/2025  Dosing instructions: Take one tablet daily  The patient has been instructed regarding the correct time, dose, and frequency of taking this medication, including desired effects and most common side effects.   Linda Stein Boom 8:29 AM 08/09/2024

## 2024-08-30 ENCOUNTER — Ambulatory Visit: Admitting: Podiatry

## 2024-09-01 ENCOUNTER — Other Ambulatory Visit: Payer: Self-pay

## 2024-09-01 MED ORDER — VOQUEZNA 20 MG PO TABS: 20.0000 mg | ORAL_TABLET | Freq: Every day | ORAL | Status: AC

## 2024-09-01 NOTE — Progress Notes (Signed)
 Medication Samples have been provided to the patient.  Drug name: Voquezna        Strength: 20 mg        Qty: 3  LOT: [id number]  Exp.Date: 08/10/2025  Dosing instructions: Take one tablet daily  The patient has been instructed regarding the correct time, dose, and frequency of taking this medication, including desired effects and most common side effects.   Linda Stein 8:38 AM 09/01/2024

## 2024-09-06 ENCOUNTER — Ambulatory Visit: Admitting: Internal Medicine

## 2024-09-13 ENCOUNTER — Ambulatory Visit: Admitting: Internal Medicine

## 2024-09-20 ENCOUNTER — Ambulatory Visit: Admitting: Internal Medicine

## 2024-09-20 ENCOUNTER — Ambulatory Visit: Admitting: Podiatry
